# Patient Record
Sex: Female | Born: 1980
Health system: Southern US, Community
[De-identification: ages and names within clinical notes are randomized; demographics above are authoritative.]

## PROBLEM LIST (undated history)

## (undated) DIAGNOSIS — Z789 Other specified health status: Secondary | ICD-10-CM

## (undated) DIAGNOSIS — T7840XA Allergy, unspecified, initial encounter: Secondary | ICD-10-CM

## (undated) HISTORY — DX: Allergy, unspecified, initial encounter: T78.40XA

## (undated) HISTORY — DX: Other specified health status: Z78.9

## (undated) HISTORY — PX: NO PAST SURGERIES: SHX2092

---

## 2015-07-09 ENCOUNTER — Ambulatory Visit (INDEPENDENT_AMBULATORY_CARE_PROVIDER_SITE_OTHER): Payer: 59 | Admitting: Internal Medicine

## 2015-07-09 ENCOUNTER — Encounter: Payer: Self-pay | Admitting: Internal Medicine

## 2015-07-09 DIAGNOSIS — J309 Allergic rhinitis, unspecified: Secondary | ICD-10-CM | POA: Insufficient documentation

## 2015-07-09 DIAGNOSIS — J302 Other seasonal allergic rhinitis: Secondary | ICD-10-CM

## 2015-07-09 DIAGNOSIS — E669 Obesity, unspecified: Secondary | ICD-10-CM | POA: Insufficient documentation

## 2015-07-09 MED ORDER — THERA VITAL M PO TABS: 1.0000 | ORAL_TABLET | Freq: Every day | ORAL | Status: DC
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
## 2015-07-09 NOTE — Patient Instructions (Signed)
All other Health Maintenance issues reviewed.   All recommended immunizations and age-appropriate screenings are up-to-date.  No immunizations administered today.   Medications reviewed and updated.  No changes recommended at this time.    Health Maintenance, Female Adopting a healthy lifestyle and getting preventive care can go a long way to promote health and wellness. Talk with your health care provider about what schedule of regular examinations is right for you. This is a good chance for you to check in with your provider about disease prevention and staying healthy. In between checkups, there are plenty of things you can do on your own. Experts have done a lot of research about which lifestyle changes and preventive measures are most likely to keep you healthy. Ask your health care provider for more information. WEIGHT AND DIET  Eat a healthy diet  Be sure to include plenty of vegetables, fruits, low-fat dairy products, and lean protein.  Do not eat a lot of foods high in solid fats, added sugars, or salt.  Get regular exercise. This is one of the most important things you can do for your health.  Most adults should exercise for at least 150 minutes each week. The exercise should increase your heart rate and make you sweat (moderate-intensity exercise).  Most adults should also do strengthening exercises at least twice a week. This is in addition to the moderate-intensity exercise.  Maintain a healthy weight  Body mass index (BMI) is a measurement that can be used to identify possible weight problems. It estimates body fat based on height and weight. Your health care provider can help determine your BMI and help you achieve or maintain a healthy weight.  For females 61 years of age and older:   A BMI below 18.5 is considered underweight.  A BMI of 18.5 to 24.9 is normal.  A BMI of 25 to 29.9 is considered overweight.  A BMI of 30 and above is considered obese.  Watch  levels of cholesterol and blood lipids  You should start having your blood tested for lipids and cholesterol at 34 years of age, then have this test every 5 years.  You may need to have your cholesterol levels checked more often if:  Your lipid or cholesterol levels are high.  You are older than 34 years of age.  You are at high risk for heart disease.  CANCER SCREENING   Lung Cancer  Lung cancer screening is recommended for adults 72-50 years old who are at high risk for lung cancer because of a history of smoking.  A yearly low-dose CT scan of the lungs is recommended for people who:  Currently smoke.  Have quit within the past 15 years.  Have at least a 30-pack-year history of smoking. A pack year is smoking an average of one pack of cigarettes a day for 1 year.  Yearly screening should continue until it has been 15 years since you quit.  Yearly screening should stop if you develop a health problem that would prevent you from having lung cancer treatment.  Breast Cancer  Practice breast self-awareness. This means understanding how your breasts normally appear and feel.  It also means doing regular breast self-exams. Let your health care provider know about any changes, no matter how small.  If you are in your 20s or 30s, you should have a clinical breast exam (CBE) by a health care provider every 1-3 years as part of a regular health exam.  If you are 40 or  older, have a CBE every year. Also consider having a breast X-ray (mammogram) every year.  If you have a family history of breast cancer, talk to your health care provider about genetic screening.  If you are at high risk for breast cancer, talk to your health care provider about having an MRI and a mammogram every year.  Breast cancer gene (BRCA) assessment is recommended for women who have family members with BRCA-related cancers. BRCA-related cancers include:  Breast.  Ovarian.  Tubal.  Peritoneal  cancers.  Results of the assessment will determine the need for genetic counseling and BRCA1 and BRCA2 testing. Cervical Cancer Your health care provider may recommend that you be screened regularly for cancer of the pelvic organs (ovaries, uterus, and vagina). This screening involves a pelvic examination, including checking for microscopic changes to the surface of your cervix (Pap test). You may be encouraged to have this screening done every 3 years, beginning at age 86.  For women ages 61-65, health care providers may recommend pelvic exams and Pap testing every 3 years, or they may recommend the Pap and pelvic exam, combined with testing for human papilloma virus (HPV), every 5 years. Some types of HPV increase your risk of cervical cancer. Testing for HPV may also be done on women of any age with unclear Pap test results.  Other health care providers may not recommend any screening for nonpregnant women who are considered low risk for pelvic cancer and who do not have symptoms. Ask your health care provider if a screening pelvic exam is right for you.  If you have had past treatment for cervical cancer or a condition that could lead to cancer, you need Pap tests and screening for cancer for at least 20 years after your treatment. If Pap tests have been discontinued, your risk factors (such as having a new sexual partner) need to be reassessed to determine if screening should resume. Some women have medical problems that increase the chance of getting cervical cancer. In these cases, your health care provider may recommend more frequent screening and Pap tests. Colorectal Cancer  This type of cancer can be detected and often prevented.  Routine colorectal cancer screening usually begins at 34 years of age and continues through 34 years of age.  Your health care provider may recommend screening at an earlier age if you have risk factors for colon cancer.  Your health care provider may also  recommend using home test kits to check for hidden blood in the stool.  A small camera at the end of a tube can be used to examine your colon directly (sigmoidoscopy or colonoscopy). This is done to check for the earliest forms of colorectal cancer.  Routine screening usually begins at age 22.  Direct examination of the colon should be repeated every 5-10 years through 34 years of age. However, you may need to be screened more often if early forms of precancerous polyps or small growths are found. Skin Cancer  Check your skin from head to toe regularly.  Tell your health care provider about any new moles or changes in moles, especially if there is a change in a mole's shape or color.  Also tell your health care provider if you have a mole that is larger than the size of a pencil eraser.  Always use sunscreen. Apply sunscreen liberally and repeatedly throughout the day.  Protect yourself by wearing long sleeves, pants, a wide-brimmed hat, and sunglasses whenever you are outside. HEART DISEASE, DIABETES,  AND HIGH BLOOD PRESSURE   High blood pressure causes heart disease and increases the risk of stroke. High blood pressure is more likely to develop in:  People who have blood pressure in the high end of the normal range (130-139/85-89 mm Hg).  People who are overweight or obese.  People who are African American.  If you are 70-80 years of age, have your blood pressure checked every 3-5 years. If you are 7 years of age or older, have your blood pressure checked every year. You should have your blood pressure measured twice--once when you are at a hospital or clinic, and once when you are not at a hospital or clinic. Record the average of the two measurements. To check your blood pressure when you are not at a hospital or clinic, you can use:  An automated blood pressure machine at a pharmacy.  A home blood pressure monitor.  If you are between 56 years and 59 years old, ask your health  care provider if you should take aspirin to prevent strokes.  Have regular diabetes screenings. This involves taking a blood sample to check your fasting blood sugar level.  If you are at a normal weight and have a low risk for diabetes, have this test once every three years after 34 years of age.  If you are overweight and have a high risk for diabetes, consider being tested at a younger age or more often. PREVENTING INFECTION  Hepatitis B  If you have a higher risk for hepatitis B, you should be screened for this virus. You are considered at high risk for hepatitis B if:  You were born in a country where hepatitis B is common. Ask your health care provider which countries are considered high risk.  Your parents were born in a high-risk country, and you have not been immunized against hepatitis B (hepatitis B vaccine).  You have HIV or AIDS.  You use needles to inject street drugs.  You live with someone who has hepatitis B.  You have had sex with someone who has hepatitis B.  You get hemodialysis treatment.  You take certain medicines for conditions, including cancer, organ transplantation, and autoimmune conditions. Hepatitis C  Blood testing is recommended for:  Everyone born from [birth year] through 1964.  Anyone with known risk factors for hepatitis C. Sexually transmitted infections (STIs)  You should be screened for sexually transmitted infections (STIs) including gonorrhea and chlamydia if:  You are sexually active and are younger than 34 years of age.  You are older than 34 years of age and your health care provider tells you that you are at risk for this type of infection.  Your sexual activity has changed since you were last screened and you are at an increased risk for chlamydia or gonorrhea. Ask your health care provider if you are at risk.  If you do not have HIV, but are at risk, it may be recommended that you take a prescription medicine daily to prevent HIV  infection. This is called pre-exposure prophylaxis (PrEP). You are considered at risk if:  You are sexually active and do not regularly use condoms or know the HIV status of your partner(s).  You take drugs by injection.  You are sexually active with a partner who has HIV. Talk with your health care provider about whether you are at high risk of being infected with HIV. If you choose to begin PrEP, you should first be tested for HIV. You should then be  tested every 3 months for as long as you are taking PrEP.  PREGNANCY   If you are premenopausal and you may become pregnant, ask your health care provider about preconception counseling.  If you may become pregnant, take 400 to 800 micrograms (mcg) of folic acid every day.  If you want to prevent pregnancy, talk to your health care provider about birth control (contraception). OSTEOPOROSIS AND MENOPAUSE   Osteoporosis is a disease in which the bones lose minerals and strength with aging. This can result in serious bone fractures. Your risk for osteoporosis can be identified using a bone density scan.  If you are 6 years of age or older, or if you are at risk for osteoporosis and fractures, ask your health care provider if you should be screened.  Ask your health care provider whether you should take a calcium or vitamin D supplement to lower your risk for osteoporosis.  Menopause may have certain physical symptoms and risks.  Hormone replacement therapy may reduce some of these symptoms and risks. Talk to your health care provider about whether hormone replacement therapy is right for you.  HOME CARE INSTRUCTIONS   Schedule regular health, dental, and eye exams.  Stay current with your immunizations.   Do not use any tobacco products including cigarettes, chewing tobacco, or electronic cigarettes.  If you are pregnant, do not drink alcohol.  If you are breastfeeding, limit how much and how often you drink alcohol.  Limit  alcohol intake to no more than 1 drink per day for nonpregnant women. One drink equals 12 ounces of beer, 5 ounces of wine, or 1 ounces of hard liquor.  Do not use street drugs.  Do not share needles.  Ask your health care provider for help if you need support or information about quitting drugs.  Tell your health care provider if you often feel depressed.  Tell your health care provider if you have ever been abused or do not feel safe at home.   This information is not intended to replace advice given to you by your health care provider. Make sure you discuss any questions you have with your health care provider.   Document Released: 12/09/2010 Document Revised: 06/16/2014 Document Reviewed: 04/27/2013 Elsevier Interactive Patient Education [employer].
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
## 2015-07-09 NOTE — Assessment & Plan Note (Signed)
Discussed importance of weight loss - reviewed medical problems the extra weight increases her risk for Regular exercise encouraged Decrease portions

## 2015-07-09 NOTE — Assessment & Plan Note (Signed)
Takes otc allergy medication as needed
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
## 2018-02-05 ENCOUNTER — Ambulatory Visit: Payer: 59 | Admitting: Internal Medicine

## 2018-02-05 ENCOUNTER — Encounter: Payer: Self-pay | Admitting: Internal Medicine

## 2018-02-05 VITALS — BP 136/88 | HR 61 | Temp 98.4°F | Resp 16 | Ht 66.0 in | Wt 185.4 lb

## 2018-02-05 DIAGNOSIS — R609 Edema, unspecified: Secondary | ICD-10-CM | POA: Insufficient documentation

## 2018-02-05 MED ORDER — CEPHALEXIN 500 MG PO CAPS: 500.0000 mg | ORAL_CAPSULE | Freq: Three times a day (TID) | ORAL | 0 refills | Status: DC

## 2018-02-05 NOTE — Patient Instructions (Addendum)
Continue advil over the weekend.  Increase your water intake.    You can continue applying heat to the area.  Massage the area.    Try sucking on sour lemon candy.      Take the antibiotic if needed for increased swelling, redness, warmth and/or fevers.   Call if no improvement on Tuesday morning.

## 2018-02-05 NOTE — Assessment & Plan Note (Signed)
The area of swelling and tenderness is consistent with the parotid gland Possible parotid stone Increase water intake Advil as needed Massage and apply heat to the area Advised her to suck on sour lemon candy No obvious evidence of infection, but concerned that symptoms have gotten worse.  She is very reluctant to take an antibiotic and I did give her a prescription and advised her over the next day or 2 if her symptoms worsen or if she has any redness or fever and she needs to take it She will call early next week if her symptoms are not getting better or worse and we will do imaging or refer to ENT
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
## 2019-02-18 ENCOUNTER — Encounter: Payer: Self-pay | Admitting: Internal Medicine

## 2019-02-18 ENCOUNTER — Other Ambulatory Visit: Payer: Self-pay

## 2019-02-18 ENCOUNTER — Ambulatory Visit (INDEPENDENT_AMBULATORY_CARE_PROVIDER_SITE_OTHER): Payer: 59 | Admitting: Internal Medicine

## 2019-02-18 DIAGNOSIS — M25551 Pain in right hip: Secondary | ICD-10-CM | POA: Diagnosis not present

## 2019-02-18 NOTE — Patient Instructions (Signed)
Make an appointment with our sports medicine doctor - Dr Tamala Julian for your hip/back pain and leg numbness/tingling.

## 2019-02-18 NOTE — Assessment & Plan Note (Signed)
Right hip and lower back pain With numbness/tingling in right leg Will refer to sports medicine for further evaluation/treatment

## 2019-03-20 NOTE — Progress Notes (Signed)
[facility] [address] [place], [place] [address] Phone: [phone] Subjective:   I Pamela Aguirre am serving as a Neurosurgeon for Dr. Antoine Primas.     CC: Right leg numbness  [id number]  Pamela Aguirre is a 38 y.o. female coming in with complaint of right leg neuropathy. Dr. Lawerance Bach believes it has something to do with her back. Numbness and tingling down the right leg to the toes.   Onset- Chronic  Location - Right leg  Duration-intermittent Character- constant numbness  Aggravating factors- laying, standing  Reliving factors-patient sensitive sometimes increasing activity or decreasing activity seems to make it better. Therapies tried-nothing specific Severity-5 out of 10 but seems to be increasing in frequency     Past Medical History:  Diagnosis Date  . Allergy    Past Surgical History:  Procedure Laterality Date  . NO PAST SURGERIES     Social History   Socioeconomic History  . Marital status: Single    Spouse name: Not on file  . Number of children: Not on file  . Years of education: Not on file  . Highest education level: Not on file  Occupational History  . Not on file  Social Needs  . Financial resource strain: Not on file  . Food insecurity    Worry: Not on file    Inability: Not on file  . Transportation needs    Medical: Not on file    Non-medical: Not on file  Tobacco Use  . Smoking status: Former Games developer  . Smokeless tobacco: Never Used  Substance and Sexual Activity  . Alcohol use: Yes    Comment: social  . Drug use: No  . Sexual activity: Not on file  Lifestyle  . Physical activity    Days per week: Not on file    Minutes per session: Not on file  . Stress: Not on file  Relationships  . Social Musician on phone: Not on file    Gets together: Not on file    Attends religious service: Not on file    Active member of club or organization: Not on file    Attends meetings of clubs or  organizations: Not on file    Relationship status: Not on file  Other Topics Concern  . Not on file  Social History Narrative   Job: Oceanographer for production      Exercise: no    No Known Allergies Family History  Problem Relation Age of Onset  . Hypertension Mother   . Diabetes Maternal Aunt   . Hypertension Maternal Grandmother   . Heart disease Maternal Grandmother          Current Outpatient Medications (Other):  .  gabapentin (NEURONTIN) 100 MG capsule, Take 2 capsules (200 mg total) by mouth at bedtime.    Past medical history, social, surgical and family history all reviewed in electronic medical record.  No pertanent information unless stated regarding to the chief complaint.   Review of Systems:  No headache, visual changes, nausea, vomiting, diarrhea, constipation, dizziness, abdominal pain, skin rash, fevers, chills, night sweats, weight loss, swollen lymph nodes, body aches, joint swelling, , chest pain, shortness of breath, mood changes.  Positive muscle aches  Objective  Blood pressure 120/90, pulse 96, height 5\' 6"  (1.676 m), weight 180 lb (81.6 kg), last menstrual period 03/13/2019, SpO2 98 %.    General: No apparent distress alert and oriented x3 mood and affect normal,  dressed appropriately.  HEENT: Pupils equal, extraocular movements intact  Respiratory: Patient's speak in full sentences and does not appear short of breath  Cardiovascular: No lower extremity edema, non tender, no erythema  Skin: Warm dry intact with no signs of infection or rash on extremities or on axial skeleton.  Abdomen: Soft nontender  Neuro: Cranial nerves II through XII are intact, neurovascularly intact in all extremities with 2+ DTRs and 2+ pulses.  Lymph: No lymphadenopathy of posterior or anterior cervical chain or axillae bilaterally.  Gait normal with good balance and coordination.  MSK:  Non tender with full range of motion and good stability and symmetric strength  and tone of shoulders, elbows, wrist,  knee and ankles bilaterally.  Hip: Right ROM Internal and external range of motion noted. Strength IR: 5/5, ER: 5/5, Flexion: 5/5, Extension: 5/5, Abduction: 5/5, Adduction: 5/5 Pelvic alignment unremarkable to inspection and palpation. Standing hip rotation and gait without trendelenburg sign / unsteadiness. Greater trochanter without tenderness to palpation. No tenderness over piriformis and greater trochanter. No pain with FABER or FADIR. No SI joint tenderness and normal minimal SI movement. Contralateral hip unremarkable  Back Exam:  Inspection: Loss of lordosis Motion: Flexion 45 deg, Extension 25 deg, Side Bending to 35 deg bilaterally,  Rotation to 35 deg bilaterally  SLR laying: Negative  XSLR laying: Negative  Palpable tenderness: Tender to palpation laterally in the paraspinal musculature of the lumbar spine. FABER: negative. Sensory change: Gross sensation intact to all lumbar and sacral dermatomes.  Reflexes: 2+ at both patellar tendons, 2+ at achilles tendons, Babinski's downgoing.  Strength at foot  Plantar-flexion: 5/5 Dorsi-flexion: 5/5 Eversion: 5/5 Inversion: 5/5  Leg strength  Quad: 5/5 Hamstring: 5/5 Hip flexor: 5/5 Hip abductors: 5/5  Gait unremarkable.  Right foot exam shows the patient does have some longitudinal arch breakdown with overpronation.  Mild breakdown the transverse arch.  No splaying between the toes.  No true neuropathy noted today.    Impression and Recommendations:     This case required medical decision making of moderate complexity. The above documentation has been reviewed and is accurate and complete Lyndal Pulley, DO       Note: This dictation was prepared with Dragon dictation along with smaller phrase technology. Any transcriptional errors that result from this process are unintentional.

## 2019-03-22 ENCOUNTER — Ambulatory Visit: Payer: 59 | Admitting: Family Medicine

## 2019-03-22 ENCOUNTER — Ambulatory Visit (INDEPENDENT_AMBULATORY_CARE_PROVIDER_SITE_OTHER)
Admission: RE | Admit: 2019-03-22 | Discharge: 2019-03-22 | Disposition: A | Discharge status: Home / Self Care | Payer: 59 | Source: Ambulatory Visit | Attending: Family Medicine | Admitting: Family Medicine

## 2019-03-22 ENCOUNTER — Encounter: Payer: Self-pay | Admitting: Family Medicine

## 2019-03-22 ENCOUNTER — Other Ambulatory Visit: Payer: Self-pay

## 2019-03-22 VITALS — BP 120/90 | HR 96 | Ht 66.0 in | Wt 180.0 lb

## 2019-03-22 DIAGNOSIS — M545 Low back pain, unspecified: Secondary | ICD-10-CM

## 2019-03-22 DIAGNOSIS — R200 Anesthesia of skin: Secondary | ICD-10-CM | POA: Insufficient documentation

## 2019-03-22 DIAGNOSIS — M79671 Pain in right foot: Secondary | ICD-10-CM | POA: Diagnosis not present

## 2019-03-22 DIAGNOSIS — M216X9 Other acquired deformities of unspecified foot: Secondary | ICD-10-CM | POA: Insufficient documentation

## 2019-03-22 DIAGNOSIS — M216X1 Other acquired deformities of right foot: Secondary | ICD-10-CM | POA: Diagnosis not present

## 2019-03-22 MED ORDER — GABAPENTIN 100 MG PO CAPS: 200.0000 mg | ORAL_CAPSULE | Freq: Every day | ORAL | 3 refills | Status: DC

## 2019-03-22 NOTE — Assessment & Plan Note (Signed)
Pronation deformity of the right ankle.

## 2019-03-22 NOTE — Assessment & Plan Note (Addendum)
Patient is having minimal pain seems to be more activity.  To be very intermittent.  Discussed with patient about icing regimen home exercise, concerned that this could be secondary to more of a lumbar radiculopathy and given mild gabapentin.  Nerve conduction study may be necessary for continued to have difficulty.  X-rays pending at the moment.  Follow-up again in 4 to 8 weeks.

## 2019-03-22 NOTE — Patient Instructions (Signed)
Good to see you Back and foot xray Spenco orthotics "total support" Vitamin d 2,000 IU and B Complex  Gabapentin 200 mg at night See me again in 5-6 weeks

## 2019-04-25 NOTE — Progress Notes (Signed)
[facility] [address] [place] [place], [place] [address] Phone: [phone] Subjective:   I Pamela Aguirre am serving as a Neurosurgeon for Dr. Antoine Primas.   CC: Foot pain and hip pain  [id number]  Pamela Aguirre is a 38 y.o. female coming in with complaint of back pain. Patient states that she is getting better. Still some pain but overall doing well.  Patient states that she is feeling approximately 80% better overall.  Patient seen some mild discomfort from time to time.  Patient is very Regulatory affairs officer on doing the exercises.  Patient unable to tolerate the gabapentin.      Past Medical History:  Diagnosis Date  . Allergy    Past Surgical History:  Procedure Laterality Date  . NO PAST SURGERIES     Social History   Socioeconomic History  . Marital status: Single    Spouse name: Not on file  . Number of children: Not on file  . Years of education: Not on file  . Highest education level: Not on file  Occupational History  . Not on file  Social Needs  . Financial resource strain: Not on file  . Food insecurity    Worry: Not on file    Inability: Not on file  . Transportation needs    Medical: Not on file    Non-medical: Not on file  Tobacco Use  . Smoking status: Former Games developer  . Smokeless tobacco: Never Used  Substance and Sexual Activity  . Alcohol use: Yes    Comment: social  . Drug use: No  . Sexual activity: Not on file  Lifestyle  . Physical activity    Days per week: Not on file    Minutes per session: Not on file  . Stress: Not on file  Relationships  . Social Musician on phone: Not on file    Gets together: Not on file    Attends religious service: Not on file    Active member of club or organization: Not on file    Attends meetings of clubs or organizations: Not on file    Relationship status: Not on file  Other Topics Concern  . Not on file  Social History Narrative   Job: Oceanographer for production    Exercise: no    No Known Allergies Family History  Problem Relation Age of Onset  . Hypertension Mother   . Diabetes Maternal Aunt   . Hypertension Maternal Grandmother   . Heart disease Maternal Grandmother    No current outpatient medications on file.    Past medical history, social, surgical and family history all reviewed in electronic medical record.  No pertanent information unless stated regarding to the chief complaint.   Review of Systems:  No headache, visual changes, nausea, vomiting, diarrhea, constipation, dizziness, abdominal pain, skin rash, fevers, chills, night sweats, weight loss, swollen lymph nodes, body aches, joint swelling, muscle aches, chest pain, shortness of breath, mood changes.   Objective  Blood pressure 120/90, pulse 66, height 5\' 6"  (1.676 m), weight 184 lb (83.5 kg), SpO2 98 %.    General: No apparent distress alert and oriented x3 mood and affect normal, dressed appropriately.  HEENT: Pupils equal, extraocular movements intact  Respiratory: Patient's speak in full sentences and does not appear short of breath  Cardiovascular: No lower extremity edema, non tender, no erythema  Skin: Warm dry intact with no signs of infection or rash on extremities or on  axial skeleton.  Abdomen: Soft nontender  Neuro: Cranial nerves II through XII are intact, neurovascularly intact in all extremities with 2+ DTRs and 2+ pulses.  Lymph: No lymphadenopathy of posterior or anterior cervical chain or axillae bilaterally.  Gait normal with good balance and coordination.  MSK:  Non tender with full range of motion and good stability and symmetric strength and tone of shoulders, elbows, wrist,  knee and ankles bilaterally.   Low back exam shows the patient does have some minimal tenderness to palpation more over this L5-S1 bilaterally.  Patient does have near full range of motion of the back though noted which is an improvement from previous exam.  Patient does have some  mild tightness with straight leg test but no radicular symptoms.  Foot exam still shows the patient has overpronation of the feet bilaterally but nontender on exam   Impression and Recommendations:     This case required medical decision making of moderate complexity. The above documentation has been reviewed and is accurate and complete Pamela Pulley, DO       Note: This dictation was prepared with Dragon dictation along with smaller phrase technology. Any transcriptional errors that result from this process are unintentional.

## 2019-04-26 ENCOUNTER — Ambulatory Visit (INDEPENDENT_AMBULATORY_CARE_PROVIDER_SITE_OTHER): Payer: 59 | Admitting: Family Medicine

## 2019-04-26 ENCOUNTER — Encounter: Payer: Self-pay | Admitting: Family Medicine

## 2019-04-26 ENCOUNTER — Other Ambulatory Visit: Payer: Self-pay

## 2019-04-26 DIAGNOSIS — M25551 Pain in right hip: Secondary | ICD-10-CM | POA: Diagnosis not present

## 2019-04-26 NOTE — Patient Instructions (Signed)
Good to see you Doing well Try the exercises twice a week at least Continue vitamins See me again in 2 months

## 2019-04-26 NOTE — Assessment & Plan Note (Signed)
Patient has made significant improvement.  No significant neck pain at the moment.  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  Will monitor.  Patient unable to tolerate the gabapentin.  Follow-up again in 4 weeks

## 2019-06-28 ENCOUNTER — Ambulatory Visit: Payer: 59 | Admitting: Family Medicine

## 2019-07-21 ENCOUNTER — Ambulatory Visit: Payer: 59 | Admitting: Podiatry

## 2019-07-21 ENCOUNTER — Other Ambulatory Visit: Payer: Self-pay

## 2019-07-21 ENCOUNTER — Encounter: Payer: Self-pay | Admitting: Podiatry

## 2019-07-21 VITALS — BP 173/84 | HR 83 | Resp 16

## 2019-07-21 DIAGNOSIS — L603 Nail dystrophy: Secondary | ICD-10-CM

## 2019-07-21 NOTE — Progress Notes (Signed)
  Subjective:  Patient ID: Pamela Aguirre, female    DOB: 11/18/1980,  MRN: [id number] HPI Chief Complaint  Patient presents with  . Nail Problem    Hallux nail right - loosened toenail-only attached at the medial side, no injury, been using antifungal solution prior to the nail detaching  . Toe Pain    5th toe right - lateral border, tender  . New Patient (Initial Visit)    39 y.o. female presents with the above complaint.   ROS: Denies fever chills nausea vomiting muscle aches pains calf pain back pain chest pain shortness of breath.  Past Medical History:  Diagnosis Date  . Allergy    Past Surgical History:  Procedure Laterality Date  . NO PAST SURGERIES     No current outpatient medications on file.  No Known Allergies Review of Systems Objective:   Vitals:   07/21/19 1514  BP: (!) 173/84  Pulse: 83  Resp: 16    General: Well developed, nourished, in no acute distress, alert and oriented x3   Dermatological: Skin is warm, dry and supple bilateral. Nails x 10 are well maintained; remaining integument appears unremarkable at this time. There are no open sores, no preulcerative lesions, no rash or signs of infection present.  A nail that is in the process of the Foley aiding there is no soft tissue involvement.  No purulence no malodor no signs of infection at all.  Vascular: Dorsalis Pedis artery and Posterior Tibial artery pedal pulses are 2/4 bilateral with immedate capillary fill time. Pedal hair growth present. No varicosities and no lower extremity edema present bilateral.   Neruologic: Grossly intact via light touch bilateral. Vibratory intact via tuning fork bilateral. Protective threshold with Semmes Wienstein monofilament intact to all pedal sites bilateral. Patellar and Achilles deep tendon reflexes 2+ bilateral. No Babinski or clonus noted bilateral.   Musculoskeletal: No gross boney pedal deformities bilateral. No pain, crepitus, or limitation noted with foot  and ankle range of motion bilateral. Muscular strength 5/5 in all groups tested bilateral.  Gait: Unassisted, Nonantalgic.    Radiographs:  None taken  Assessment & Plan:   Assessment: Nail dystrophy loose nail hallux right.  Adductovarus rotated hammertoe deformity fifth right  Plan: Discussed appropriate shoe gear stretching exercise ice therapy shoe gear modifications.  Discussed surgical intervention regarding derotational arthroplasty fifth digit right foot and I helped her trim the toenail off today.  Removed it in total without complications and without anesthesia.     Max T. [place], [place]

## 2019-08-30 ENCOUNTER — Ambulatory Visit (INDEPENDENT_AMBULATORY_CARE_PROVIDER_SITE_OTHER): Payer: 59 | Admitting: Family Medicine

## 2019-08-30 ENCOUNTER — Encounter: Payer: Self-pay | Admitting: Family Medicine

## 2019-08-30 ENCOUNTER — Other Ambulatory Visit: Payer: Self-pay

## 2019-08-30 VITALS — BP 140/96 | HR 81 | Ht 66.0 in | Wt 185.4 lb

## 2019-08-30 DIAGNOSIS — S39012A Strain of muscle, fascia and tendon of lower back, initial encounter: Secondary | ICD-10-CM | POA: Diagnosis not present

## 2019-08-30 DIAGNOSIS — S29012A Strain of muscle and tendon of back wall of thorax, initial encounter: Secondary | ICD-10-CM

## 2019-08-30 DIAGNOSIS — Q7649 Other congenital malformations of spine, not associated with scoliosis: Secondary | ICD-10-CM | POA: Insufficient documentation

## 2019-08-30 NOTE — Patient Instructions (Addendum)
Thank you for coming in today. Attend PT.  Recheck with Dr Katrinka Blazing or myself in 6 weeks unless better.  Return or contact me if not doing well.  Come back or go to the emergency room if you notice new weakness new numbness problems walking or bowel or bladder problems.  Use a heating pad and TENS unit.   TENS UNIT: This is helpful for muscle pain and spasm.   Search and Purchase a TENS 7000 2nd edition at  www.tenspros.com or www.Amazon.com It should be less than $30.     TENS unit instructions: Do not shower or bathe with the unit on Turn the unit off before removing electrodes or batteries If the electrodes lose stickiness add a drop of water to the electrodes after they are disconnected from the unit and place on plastic sheet. If you continued to have difficulty, call the TENS unit company to purchase more electrodes. Do not apply lotion on the skin area prior to use. Make sure the skin is clean and dry as this will help prolong the life of the electrodes. After use, always check skin for unusual red areas, rash or other skin difficulties. If there are any skin problems, does not apply electrodes to the same area. Never remove the electrodes from the unit by pulling the wires. Do not use the TENS unit or electrodes other than as directed. Do not change electrode placement without consultating your therapist or physician. Keep 2 fingers with between each electrode. Wear time ratio is 2:1, on to off times.    For example on for 30 minutes off for 15 minutes and then on for 30 minutes off for 15 minutes      Lumbosacral Strain Lumbosacral strain is an injury that causes pain in the lower back (lumbosacral spine). This injury usually happens from overstretching the muscles or ligaments along your spine. Ligaments are cord-like tissues that connect bones to other bones. A strain can affect one or more muscles or ligaments. What are the causes? This condition may be caused by:  A  hard, direct hit to the back.  Overstretching the lower back muscles. This may result from: ? A fall. ? Lifting something heavy. ? Repetitive movements such as bending or crouching. What increases the risk? The following factors may make you more likely to develop this condition:  Participating in sports or activities that involve: ? A sudden twist of the back. ? Pushing or pulling motions.  Being overweight or obese.  Having poor strength and flexibility, especially tight hamstrings or weak muscles in the back or abdomen.  Having too much of a curve in the lower back.  Having a pelvis that is tilted forward. What are the signs or symptoms? The main symptom of this condition is pain in the lower back, at the site of the strain. Pain may also be felt down one or both legs. How is this diagnosed? This condition is diagnosed based on your symptoms, your medical history, and a physical exam. During the physical exam, your health care provider may push on certain areas of your back to find the source of your pain. You may be asked to bend forward, backward, and side to side to check your pain and range of motion. You may also have imaging tests, such as X-rays and an MRI. How is this treated? This condition may be treated by:  Applying heat and cold on the affected area.  Taking medicines to help relieve pain and relax your  muscles.  Taking NSAIDs, such as ibuprofen, to help reduce swelling and discomfort.  Doing stretching and strengthening exercises for your lower back. Symptoms usually improve within several weeks of treatment. However, recovery time varies. When your symptoms improve, gradually return to your normal routine as soon as possible to reduce pain, avoid stiffness, and keep muscle strength. Follow these instructions at home: Medicines  Take over-the-counter and prescription medicines only as told by your health care provider.  Ask your health care provider if the  medicine prescribed to you: ? Requires you to avoid driving or using heavy machinery. ? Can cause constipation. You may need to take these actions to prevent or treat constipation:  Drink enough fluid to keep your urine pale yellow.  Take over-the-counter or prescription medicines.  Eat foods that are high in fiber, such as beans, whole grains, and fresh fruits and vegetables.  Limit foods that are high in fat and processed sugars, such as fried or sweet foods. Managing pain, stiffness, and swelling      If directed, put ice on the injured area. To do this: ? Put ice in a plastic bag. ? Place a towel between your skin and the bag. ? Leave the ice on for 20 minutes, 2-3 times a day.  If directed, apply heat on the affected area as often as told by your health care provider. Use the heat source that your health care provider recommends, such as a moist heat pack or a heating pad. ? Place a towel between your skin and the heat source. ? Leave the heat on for 20-30 minutes. ? Remove the heat if your skin turns bright red. This is especially important if you are unable to feel pain, heat, or cold. You may have a greater risk of getting burned. Activity  Rest as told by your health care provider.  Do not stay in bed. Staying in bed for more than 1-2 days can delay your recovery.  Return to your normal activities as told by your health care provider. Ask your health care provider what activities are safe for you.  Avoid activities that take a lot of energy for as long as told by your health care provider.  Do exercises as told by your health care provider. This includes stretching and strengthening exercises. General instructions  Sit up and stand up straight. Avoid leaning forward when you sit, or hunching over when you stand.  Do not use any products that contain nicotine or tobacco, such as cigarettes, e-cigarettes, and chewing tobacco. If you need help quitting, ask your health  care provider.  Keep all follow-up visits as told by your health care provider. This is important. How is this prevented?   Use correct form when playing sports and lifting heavy objects.  Use good posture when sitting and standing.  Maintain a healthy weight.  Sleep on a mattress with medium firmness to support your back.  Do at least 150 minutes of moderate-intensity exercise each week, such as brisk walking or water aerobics. Try a form of exercise that takes stress off your back, such as swimming or stationary cycling.  Maintain physical fitness, including: ? Strength. ? Flexibility. Contact a health care provider if:  Your back pain does not improve after several weeks of treatment.  Your symptoms get worse. Get help right away if:  Your back pain is severe.  You cannot stand or walk.  You have difficulty controlling when you urinate or when you have a bowel movement.  You feel nauseous or you vomit.  Your feet or legs get very cold, turn pale, or look blue.  You have numbness, tingling, weakness, or problems using your arms or legs.  You develop any of the following: ? Shortness of breath. ? Dizziness. ? Pain in your legs. ? Weakness in your buttocks or legs. Summary  Lumbosacral strain is an injury that causes pain in the lower back (lumbosacral spine).  This injury usually happens from overstretching the muscles or ligaments along your spine.  This condition may be caused by a direct hit to the lower back or by overstretching the lower back muscles.  Symptoms usually improve within several weeks of treatment. This information is not intended to replace advice given to you by your health care provider. Make sure you discuss any questions you have with your health care provider. Document Revised: 10/19/2018 Document Reviewed: 10/19/2018 Elsevier Patient Education  2020 [employer].

## 2019-08-30 NOTE — Progress Notes (Signed)
I, Christoper Fabian, LAT, ATC, am serving as scribe for Dr. Clementeen Graham.  Pamela Aguirre is a 39 y.o. female who presents to [facility] today for R-sided low back pain.  She was last seen by Dr. Silvio Clayman on 04/26/19 for her R hip and was provided a HEP and advised on some OTC supplements/vitamins.  She had previously tried Gabapentin but could not tolerate this medication.  Since her last visit w/ Dr. Katrinka Blazing, pt reports midline low back pain and R-sided mid-Tspine pain x approximately 2 weeks w/ no known MOI.  Aggravating factors include sitting, prolonged standing, laying supine, lumbar flexion.  She reports some radiating pain into her B LEs.  She denies any numbness/tingling or weakness in her B LEs.  She is taking IBU.  Diagnostic imaging: L-spine XR- 03/22/19   Pertinent review of systems: No fevers or chills  Relevant historical information: Hemivertebra L4 dextroconvex with lateral listhesis on x-ray October 2020   Exam:  BP (!) 140/96 (BP Location: Left Arm, Patient Position: Sitting, Cuff Size: Normal)   Pulse 81   Ht 5\' 6"  (1.676 m)   Wt 185 lb 6.4 oz (84.1 kg)   SpO2 98%   BMI 29.92 kg/m  General: Well Developed, well nourished, and in no acute distress.   MSK:  C-spine normal.  Nontender normal motion. T-spine normal-appearing nontender midline.  Tender palpation right rhomboid and thoracic paraspinal area. Normal thoracic motion. Normal upper extremity scapular motion. L-spine: Nontender midline.  Tender palpation and rigid paraspinal musculature right paraspinal muscles. Decreased lumbar motion. Lower extremity strength is intact throughout.  Reflexes and sensation are intact throughout. Mild antalgic gait.    Lab and Radiology Results  EXAM: LUMBAR SPINE - COMPLETE 4+ VIEW  COMPARISON:  None.  FINDINGS: Hemivertebra at the L4 level with dextroconvexity away from the hypoplasticw left side of the vertebral body. Disc space between  L 3-4 and L4-5. Degenerative changes in the posterior elements. Approximately 15-16 degrees of curvature related to this congenital vertebral abnormality. Right lateral listhesis of L4 on L5 approximately 8 mm  IMPRESSION: Congenital vertebral abnormality with spinal curvature and some right listhesis of L4 on L5 approximately 8 mm. This may explain patient's symptoms and would be better evaluated utilizing MRI.   Electronically Signed   By: M.D.   On: 03/23/2019 11:26 I, 03/25/2019, personally (independently) visualized and performed the interpretation of the images attached in this note.     Assessment and Plan: 39 y.o. female with low back pain and right rhomboid/thoracic paraspinal pain.  Symptoms ongoing for about 2 weeks.  Symptoms consistent with strain and spasm.  She does have congenital abnormality in L-spine that probably is contributing to this as well.  Fortunately she does not have radicular pain in her signs or symptoms suggesting neurologic problem.  Plan for heating pad TENS unit physical therapy referral.  Discussed muscle relaxers patient declined which is reasonable.  Continue prescription strength Tylenol and NSAIDs.  Caution against high-dose frequent use of NSAIDs due to elevated blood pressure. Recheck with myself or Dr. 20 in about 6 weeks especially if not better. Stressed the importance of ongoing core stabilizing exercises to prevent further back pain episodes with congenital spinal abnormality    Orders Placed This Encounter  Procedures  . Ambulatory referral to Physical Therapy    Referral Priority:   Routine    Referral Type:   Physical Medicine    Referral Reason:  Specialty Services Required    Requested Specialty:   Physical Therapy   No orders of the defined types were placed in this encounter.    Discussed warning signs or symptoms. Please see discharge instructions. Patient expresses understanding.   The above  documentation has been reviewed and is accurate and complete Lynne Leader

## 2019-09-02 ENCOUNTER — Ambulatory Visit (INDEPENDENT_AMBULATORY_CARE_PROVIDER_SITE_OTHER): Payer: 59 | Admitting: Rehabilitative and Restorative Service Providers"

## 2019-09-02 ENCOUNTER — Other Ambulatory Visit: Payer: Self-pay

## 2019-09-02 ENCOUNTER — Encounter: Payer: Self-pay | Admitting: Rehabilitative and Restorative Service Providers"

## 2019-09-02 DIAGNOSIS — R29898 Other symptoms and signs involving the musculoskeletal system: Secondary | ICD-10-CM

## 2019-09-02 DIAGNOSIS — M545 Low back pain, unspecified: Secondary | ICD-10-CM
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
## 2019-09-02 NOTE — Patient Instructions (Signed)
Access Code: [email or web address]: [email or web address]: 03/26/2021Prepared by: Chiquetta Langner HoltExercises  Cat-Camel - 2 x daily - 7 x weekly - 1 sets - 5 reps - 5 sec hold  Cat-Camel to Child's Pose - 2 x daily - 7 x weekly - 1 sets - 3 reps - 20sec hold  Supine Double Knee to Chest - 2 x daily - 7 x weekly - 1 sets - 3 reps - 20 sec hold  Supine Transversus Abdominis Bracing - Hands on Ground - 5 x daily - 7 x weekly - 1 sets - 10 reps - 10 sec hold Patient Education  TENS Unit  Posture and Body Mechanics  Trigger Point Dry Needling

## 2019-09-12 ENCOUNTER — Ambulatory Visit: Payer: 59 | Admitting: Rehabilitative and Restorative Service Providers"

## 2019-09-12 ENCOUNTER — Other Ambulatory Visit: Payer: Self-pay

## 2019-09-12 ENCOUNTER — Encounter: Payer: Self-pay | Admitting: Rehabilitative and Restorative Service Providers"

## 2019-09-12 ENCOUNTER — Ambulatory Visit (INDEPENDENT_AMBULATORY_CARE_PROVIDER_SITE_OTHER): Payer: 59 | Admitting: Rehabilitative and Restorative Service Providers"

## 2019-09-12 DIAGNOSIS — M545 Low back pain, unspecified: Secondary | ICD-10-CM

## 2019-09-12 DIAGNOSIS — R29898 Other symptoms and signs involving the musculoskeletal system: Secondary | ICD-10-CM

## 2019-09-12 NOTE — Patient Instructions (Signed)
Access Code: [email or web address]: [email or web address]: 04/05/2021Prepared by: Legend Tumminello HoltExercises  Sit to Stand without Arm Support - 2 x daily - 7 x weekly - 1 sets - 10 reps  Supine Bridge - 2 x daily - 7 x weekly - 3 reps - 1 sets - 30 sec hold  Single Leg Bridge - 2 x daily - 7 x weekly - 3 reps - 1 sets - 30 sec hold  Bridge with Arms Raised - 2 x daily - 7 x weekly - 1 sets - 10 reps - 5 sec hold  Wall Quarter Squat - 2 x daily - 7 x weekly - 1 sets - 10 reps - 10 sec hold  Standing Plank on Wall - 2 x daily - 7 x weekly - 1 sets - 10 reps - 30 sec hold  Plank on Counter - 2 x daily - 7 x weekly - 3 reps - 1 sets - 30 sec hold  Mountain Climber on Counter - 2 x daily - 7 x weekly - 3 reps - 1 sets - 30 sec hold  Plank with Hip Extension on Counter - 2 x daily - 7 x weekly - 3 reps - 1 sets - 30 sec hold  Plank with Shoulder Flexion on Counter - 2 x daily - 7 x weekly - 3 reps - 1 sets - 30 sec hold
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
## 2019-09-16 ENCOUNTER — Encounter: Payer: 59 | Admitting: Physical Therapy

## 2019-09-19 ENCOUNTER — Encounter: Payer: 59 | Admitting: Physical Therapy

## 2019-09-23 ENCOUNTER — Encounter: Payer: 59 | Admitting: Physical Therapy

## 2019-10-11 ENCOUNTER — Ambulatory Visit: Payer: 59 | Admitting: Family Medicine

## 2019-10-11 ENCOUNTER — Other Ambulatory Visit: Payer: Self-pay

## 2019-10-11 ENCOUNTER — Encounter: Payer: Self-pay | Admitting: Family Medicine

## 2019-10-11 VITALS — BP 154/90 | HR 69 | Ht 66.0 in | Wt 192.0 lb

## 2019-10-11 DIAGNOSIS — S39012D Strain of muscle, fascia and tendon of lower back, subsequent encounter: Secondary | ICD-10-CM

## 2019-10-11 DIAGNOSIS — Q7649 Other congenital malformations of spine, not associated with scoliosis: Secondary | ICD-10-CM | POA: Diagnosis not present

## 2019-10-11 NOTE — Patient Instructions (Signed)
Thank you for coming in today.  Maintain core strength.  Continue limited PT.  Could do future PT if needed.   Recheck with me as needed.

## 2019-10-11 NOTE — Progress Notes (Signed)
   I, Pamela Aguirre, LAT, ATC, am serving as scribe for Dr. Clementeen Graham.  Pamela Aguirre is a 39 y.o. female who presents to [facility] today for f/u of R-sided T-spine and low back pain.  She was last seen by Dr. Denyse Aguirre on 08/30/19 and was referred to outpatient PT of which she has completed 2 visits.  Since her last visit, pt reports her lower back is still painful with certain ADLs. She states she stands for a long period of time and feels tightness in her back.   Diagnostic imaging: L-spine XR- 03/21/20   Pertinent review of systems: No fevers or chills  Relevant historical information: Hemivertebrae L4 with dextroconvex scoliosis   Exam:  BP (!) 154/90 (BP Location: Left Arm, Patient Position: Sitting)   Pulse 69   Ht 5\' 6"  (1.676 m)   Wt 192 lb (87.1 kg)   SpO2 96%   BMI 30.99 kg/m  General: Well Developed, well nourished, and in no acute distress.   MSK: L-spine nontender. Normal lumbar motion. Leg lengths equal    Lab and Radiology Results  EXAM: LUMBAR SPINE - COMPLETE 4+ VIEW  COMPARISON:  None.  FINDINGS: Hemivertebra at the L4 level with dextroconvexity away from the hypoplasticw left side of the vertebral body. Disc space between L 3-4 and L4-5. Degenerative changes in the posterior elements. Approximately 15-16 degrees of curvature related to this congenital vertebral abnormality. Right lateral listhesis of L4 on L5 approximately 8 mm  IMPRESSION: Congenital vertebral abnormality with spinal curvature and some right listhesis of L4 on L5 approximately 8 mm. This may explain patient's symptoms and would be better evaluated utilizing MRI.   Electronically Signed   By: M.D.   On: 03/23/2019 11:26  I, 03/25/2019, personally (independently) visualized and performed the interpretation of the images attached in this note.     Assessment and Plan: 39 y.o. female with improved low back pain.  Patient  has done well with very limited initial physical therapy.  Recommend continuing physical therapy and once physical therapy completed ongoing core strengthening home exercise program.  Discussed that she does have a congenital abnormality in lumbar spine with a hemivertebrae at L4.  This is likely going to be a source of pain going forward.  Core strengthening will be very helpful to prevent or reduce episodes of further pain.  If needed in the future could proceed with further work-up including MRI and potential injection planning or even surgery.  However if she is able to get her core strong enough and feel well there is no need for further treatment otherwise.  Recheck back as needed  Discussed warning signs or symptoms. Please see discharge instructions. Patient expresses understanding.   The above documentation has been reviewed and is accurate and complete 20

## 2020-02-08 LAB — OB RESULTS CONSOLE HEPATITIS B SURFACE ANTIGEN: Hepatitis B Surface Ag: NEGATIVE

## 2020-02-08 LAB — OB RESULTS CONSOLE GC/CHLAMYDIA
Chlamydia: NEGATIVE
Gonorrhea: NEGATIVE

## 2020-02-08 LAB — OB RESULTS CONSOLE HIV ANTIBODY (ROUTINE TESTING): HIV: NONREACTIVE

## 2020-02-08 LAB — OB RESULTS CONSOLE ABO/RH: RH Type: POSITIVE

## 2020-02-08 LAB — OB RESULTS CONSOLE RPR: RPR: NONREACTIVE

## 2020-02-08 LAB — OB RESULTS CONSOLE ANTIBODY SCREEN: Antibody Screen: NEGATIVE

## 2020-02-08 LAB — OB RESULTS CONSOLE RUBELLA ANTIBODY, IGM: Rubella: IMMUNE

## 2020-06-09 NOTE — L&D Delivery Note (Signed)
Delivery Note At 5:33 PM a viable and healthy female was delivered via .  Presentation: vertex; Position: Left,, Occiput,, Anterior;   Delivery of the head: spontaneous   Second maneuver: ,  Woods screw Third maneuver: ,  McRoberts,      APGAR: 8, 9; weight pending .   Placenta status: spontaneous, intact.   Cord:  with the following complications: Gilbert x one .  Cord pH: na  Anesthesia:  epdural Episiotomy:  na Lacerations:  second Suture Repair: 2.0 vicryl rapide Est. Blood Loss (mL):  400  Mom to postpartum.  Baby to Couplet care / Skin to Skin.  Amore Grater J 09/03/2020, 5:52 PM

## 2020-07-27 LAB — OB RESULTS CONSOLE GBS: GBS: POSITIVE

## 2020-08-21 ENCOUNTER — Observation Stay (HOSPITAL_COMMUNITY): Payer: 59

## 2020-08-21 ENCOUNTER — Other Ambulatory Visit (HOSPITAL_COMMUNITY): Payer: Self-pay | Admitting: *Deleted

## 2020-08-21 ENCOUNTER — Observation Stay (HOSPITAL_COMMUNITY)
Admission: AD | Admit: 2020-08-21 | Discharge: 2020-08-21 | Disposition: A | Discharge status: Home / Self Care | Payer: 59 | Attending: Obstetrics | Admitting: Obstetrics

## 2020-08-21 ENCOUNTER — Encounter (HOSPITAL_COMMUNITY): Payer: Self-pay | Admitting: Obstetrics and Gynecology

## 2020-08-21 ENCOUNTER — Encounter (HOSPITAL_COMMUNITY): Payer: Self-pay | Admitting: Anesthesiology

## 2020-08-21 DIAGNOSIS — Z3A38 38 weeks gestation of pregnancy: Secondary | ICD-10-CM | POA: Insufficient documentation

## 2020-08-21 DIAGNOSIS — Z20822 Contact with and (suspected) exposure to covid-19: Secondary | ICD-10-CM | POA: Insufficient documentation

## 2020-08-21 DIAGNOSIS — O4700 False labor before 37 completed weeks of gestation, unspecified trimester: Secondary | ICD-10-CM | POA: Diagnosis present

## 2020-08-21 DIAGNOSIS — O321XX0 Maternal care for breech presentation, not applicable or unspecified: Secondary | ICD-10-CM | POA: Diagnosis not present

## 2020-08-21 DIAGNOSIS — O329XX0 Maternal care for malpresentation of fetus, unspecified, not applicable or unspecified: Secondary | ICD-10-CM | POA: Diagnosis present

## 2020-08-21 LAB — CBC
HCT: 38 % (ref 36.0–46.0)
Hemoglobin: 12.2 g/dL (ref 12.0–15.0)
MCH: 29.4 pg (ref 26.0–34.0)
MCHC: 32.1 g/dL (ref 30.0–36.0)
MCV: 91.6 fL (ref 80.0–100.0)
Platelets: 183 10*3/uL (ref 150–400)
RBC: 4.15 MIL/uL (ref 3.87–5.11)
RDW: 14.7 % (ref 11.5–15.5)
WBC: 8.9 10*3/uL (ref 4.0–10.5)
nRBC: 0 % (ref 0.0–0.2)

## 2020-08-21 LAB — TYPE AND SCREEN
ABO/RH(D): O POS
Antibody Screen: NEGATIVE

## 2020-08-21 LAB — RPR: RPR Ser Ql: NONREACTIVE

## 2020-08-21 LAB — RESP PANEL BY RT-PCR (FLU A&B, COVID) ARPGX2
Influenza A by PCR: NEGATIVE
Influenza B by PCR: NEGATIVE
SARS Coronavirus 2 by RT PCR: NEGATIVE

## 2020-08-21 MED ORDER — ACETAMINOPHEN 325 MG PO TABS: 650.0000 mg | ORAL_TABLET | ORAL | Status: DC | PRN

## 2020-08-21 MED ORDER — PRENATAL MULTIVITAMIN CH: 1.0000 | ORAL_TABLET | Freq: Every day | ORAL | Status: DC

## 2020-08-21 MED ORDER — LACTATED RINGERS IV BOLUS: 500.0000 mL | Freq: Once | INTRAVENOUS | Status: DC

## 2020-08-21 MED ORDER — TERBUTALINE SULFATE 1 MG/ML IJ SOLN
INTRAMUSCULAR | Status: AC
  Filled 2020-08-21: qty 1

## 2020-08-21 MED ORDER — PANTOPRAZOLE SODIUM 40 MG PO PACK: 20.0000 mg | PACK | Freq: Every day | ORAL | Status: DC

## 2020-08-21 MED ORDER — DOCUSATE SODIUM 100 MG PO CAPS: 100.0000 mg | ORAL_CAPSULE | Freq: Every day | ORAL | Status: DC

## 2020-08-21 MED ORDER — INDOMETHACIN 25 MG PO CAPS: 25.0000 mg | ORAL_CAPSULE | Freq: Four times a day (QID) | ORAL | Status: DC

## 2020-08-21 MED ORDER — LACTATED RINGERS IV SOLN: INTRAVENOUS | Status: DC

## 2020-08-21 MED ORDER — CALCIUM CARBONATE ANTACID 500 MG PO CHEW: 2.0000 | CHEWABLE_TABLET | ORAL | Status: DC | PRN

## 2020-08-21 MED ORDER — ZOLPIDEM TARTRATE 5 MG PO TABS: 5.0000 mg | ORAL_TABLET | Freq: Every evening | ORAL | Status: DC | PRN

## 2020-08-21 MED ORDER — BETAMETHASONE SOD PHOS & ACET 6 (3-3) MG/ML IJ SUSP: 12.0000 mg | Freq: Once | INTRAMUSCULAR | Status: DC

## 2020-08-21 MED ORDER — TERBUTALINE SULFATE 1 MG/ML IJ SOLN: 0.2500 mg | Freq: Once | INTRAMUSCULAR | Status: DC

## 2020-08-21 MED ORDER — INDOMETHACIN 25 MG PO CAPS: 50.0000 mg | ORAL_CAPSULE | Freq: Once | ORAL | Status: DC

## 2020-08-21 NOTE — Progress Notes (Signed)
ECV started with both providers. Monitors removed for procedure. No terbutaline given per MD.

## 2020-08-21 NOTE — Progress Notes (Signed)
At [facility] for sono at MD request.   Position - R transverse with fetal spine superior. + FHM Placenta R posterolateral AFI subjectively wnl  Neta Mends, MSN, CNM 08/21/2020, 8:54 AM

## 2020-08-21 NOTE — Progress Notes (Signed)
ECV complete and successful. Monitors applied. Dr. Billy Coast wants FHR monitor before discharge.

## 2020-08-21 NOTE — Procedures (Signed)
Breech confirmed Reactive NST BP 135/77   Pulse 89   Temp 98 F (36.7 C) (Oral)   Resp 18  Forward roll performed without complications. Reactive NST post procedure. DC home with labor precautions.

## 2020-08-21 NOTE — H&P (Signed)
Pamela Aguirre is a 40 y.o. female presenting for ECV. OB History    Gravida  1   Para      Term      Preterm      AB      Living        SAB      IAB      Ectopic      Multiple      Live Births             Past Medical History:  Diagnosis Date  . Allergy    Past Surgical History:  Procedure Laterality Date  . NO PAST SURGERIES     Family History: family history includes Diabetes in her maternal aunt; Heart disease in her maternal grandmother; Hypertension in her maternal grandmother and mother. Social History:  reports that she has quit smoking. She has never used smokeless tobacco. She reports previous alcohol use. She reports that she does not use drugs.     Maternal Diabetes: No Genetic Screening: Normal Maternal Ultrasounds/Referrals: Normal Fetal Ultrasounds or other Referrals:  None Maternal Substance Abuse:  No Significant Maternal Medications:  None Significant Maternal Lab Results:  Group B Strep negative Other Comments:  None  Review of Systems  Constitutional: Negative.   All other systems reviewed and are negative.  Maternal Medical History:  Fetal activity: Perceived fetal activity is normal.   Last perceived fetal movement was within the past hour.    Prenatal Complications - Diabetes: none.      Blood pressure 131/75, pulse 89, temperature 98 F (36.7 C), temperature source Oral, resp. rate 18. Maternal Exam:  Uterine Assessment: Contraction strength is mild.  Contraction frequency is rare.   Abdomen: Patient reports no abdominal tenderness. Fetal presentation: breech  Introitus: Normal vulva. Normal vagina.  Ferning test: not done.  Nitrazine test: not done. Amniotic fluid character: not assessed.  Pelvis: questionable for delivery.      Physical Exam Constitutional:      Appearance: Normal appearance. She is normal weight.  HENT:     Head: Normocephalic.  Cardiovascular:     Rate and Rhythm: Regular rhythm.      Pulses: Normal pulses.     Heart sounds: Normal heart sounds.  Pulmonary:     Effort: Pulmonary effort is normal.     Breath sounds: Normal breath sounds.  Abdominal:     General: Bowel sounds are normal.     Palpations: Abdomen is soft.  Genitourinary:    General: Normal vulva.  Musculoskeletal:        General: Normal range of motion.     Cervical back: Normal range of motion and neck supple.  Skin:    General: Skin is warm and dry.  Neurological:     General: No focal deficit present.     Mental Status: She is alert and oriented to person, place, and time.  Psychiatric:        Mood and Affect: Mood normal.        Behavior: Behavior normal.     Prenatal labs: ABO, Rh:  pos Antibody:  neg Rubella:  imm RPR:   neg HBsAg:   neg HIV:   neg GBS:   neg  Assessment/Plan: 38 week IUP Breech ECV Risks vs benefits of procedure discussed. Consent done.    TAAVON,RICHARD J 08/21/2020, 8:14 AM

## 2020-08-30 ENCOUNTER — Telehealth (HOSPITAL_COMMUNITY): Payer: Self-pay | Admitting: *Deleted

## 2020-08-30 ENCOUNTER — Encounter (HOSPITAL_COMMUNITY): Payer: Self-pay | Admitting: *Deleted

## 2020-08-30 NOTE — Telephone Encounter (Signed)
Preadmission screen  

## 2020-08-31 ENCOUNTER — Other Ambulatory Visit: Payer: Self-pay | Admitting: Obstetrics and Gynecology

## 2020-09-01 ENCOUNTER — Other Ambulatory Visit (HOSPITAL_COMMUNITY): Payer: 59 | Attending: Obstetrics and Gynecology

## 2020-09-03 ENCOUNTER — Inpatient Hospital Stay (HOSPITAL_COMMUNITY)
Admission: AD | Admit: 2020-09-03 | Discharge: 2020-09-04 | DRG: 807 | Disposition: A | Discharge status: Home / Self Care | Payer: 59 | Attending: Obstetrics and Gynecology | Admitting: Obstetrics and Gynecology

## 2020-09-03 ENCOUNTER — Encounter (HOSPITAL_COMMUNITY): Payer: Self-pay | Admitting: Obstetrics and Gynecology

## 2020-09-03 ENCOUNTER — Inpatient Hospital Stay (HOSPITAL_COMMUNITY): Payer: 59 | Admitting: Anesthesiology

## 2020-09-03 ENCOUNTER — Other Ambulatory Visit: Payer: 59

## 2020-09-03 ENCOUNTER — Inpatient Hospital Stay (HOSPITAL_COMMUNITY): Payer: 59

## 2020-09-03 ENCOUNTER — Other Ambulatory Visit: Payer: Self-pay

## 2020-09-03 DIAGNOSIS — Z3490 Encounter for supervision of normal pregnancy, unspecified, unspecified trimester: Secondary | ICD-10-CM | POA: Diagnosis present

## 2020-09-03 DIAGNOSIS — Z87891 Personal history of nicotine dependence: Secondary | ICD-10-CM | POA: Diagnosis not present

## 2020-09-03 DIAGNOSIS — B951 Streptococcus, group B, as the cause of diseases classified elsewhere: Secondary | ICD-10-CM | POA: Diagnosis present

## 2020-09-03 DIAGNOSIS — O99824 Streptococcus B carrier state complicating childbirth: Secondary | ICD-10-CM | POA: Diagnosis present

## 2020-09-03 DIAGNOSIS — Z3A40 40 weeks gestation of pregnancy: Secondary | ICD-10-CM | POA: Diagnosis not present

## 2020-09-03 DIAGNOSIS — O480 Post-term pregnancy: Principal | ICD-10-CM | POA: Diagnosis present

## 2020-09-03 DIAGNOSIS — O99214 Obesity complicating childbirth: Secondary | ICD-10-CM | POA: Diagnosis present

## 2020-09-03 LAB — TYPE AND SCREEN
ABO/RH(D): O POS
Antibody Screen: NEGATIVE

## 2020-09-03 LAB — CBC
HCT: 36.7 % (ref 36.0–46.0)
Hemoglobin: 12.2 g/dL (ref 12.0–15.0)
MCH: 29.3 pg (ref 26.0–34.0)
MCHC: 33.2 g/dL (ref 30.0–36.0)
MCV: 88 fL (ref 80.0–100.0)
Platelets: 206 10*3/uL (ref 150–400)
RBC: 4.17 MIL/uL (ref 3.87–5.11)
RDW: 14.8 % (ref 11.5–15.5)
WBC: 10.5 10*3/uL (ref 4.0–10.5)
nRBC: 0 % (ref 0.0–0.2)

## 2020-09-03 LAB — RPR: RPR Ser Ql: NONREACTIVE

## 2020-09-03 MED ORDER — ONDANSETRON HCL 4 MG/2ML IJ SOLN: 4.0000 mg | INTRAMUSCULAR | Status: DC | PRN

## 2020-09-03 MED ORDER — METHYLERGONOVINE MALEATE 0.2 MG PO TABS: 0.2000 mg | ORAL_TABLET | ORAL | Status: DC | PRN

## 2020-09-03 MED ORDER — OXYTOCIN-SODIUM CHLORIDE 30-0.9 UT/500ML-% IV SOLN
1.0000 m[IU]/min | INTRAVENOUS | Status: DC
  Administered 2020-09-03: 2 m[IU]/min via INTRAVENOUS
  Filled 2020-09-03: qty 500

## 2020-09-03 MED ORDER — TRANEXAMIC ACID-NACL 1000-0.7 MG/100ML-% IV SOLN
INTRAVENOUS | Status: AC
  Administered 2020-09-03: 1000 mg
  Filled 2020-09-03: qty 100

## 2020-09-03 MED ORDER — DIPHENHYDRAMINE HCL 50 MG/ML IJ SOLN: 12.5000 mg | INTRAMUSCULAR | Status: DC | PRN

## 2020-09-03 MED ORDER — OXYTOCIN-SODIUM CHLORIDE 30-0.9 UT/500ML-% IV SOLN: 2.5000 [IU]/h | INTRAVENOUS | Status: DC

## 2020-09-03 MED ORDER — IBUPROFEN 600 MG PO TABS
600.0000 mg | ORAL_TABLET | Freq: Four times a day (QID) | ORAL | Status: DC
  Administered 2020-09-03 – 2020-09-04 (×4): 600 mg via ORAL
  Filled 2020-09-03 (×4): qty 1

## 2020-09-03 MED ORDER — OXYCODONE-ACETAMINOPHEN 5-325 MG PO TABS: 1.0000 | ORAL_TABLET | ORAL | Status: DC | PRN

## 2020-09-03 MED ORDER — PRENATAL MULTIVITAMIN CH
1.0000 | ORAL_TABLET | Freq: Every day | ORAL | Status: DC
  Administered 2020-09-04: 1 via ORAL
  Filled 2020-09-03: qty 1

## 2020-09-03 MED ORDER — SIMETHICONE 80 MG PO CHEW: 80.0000 mg | CHEWABLE_TABLET | ORAL | Status: DC | PRN

## 2020-09-03 MED ORDER — OXYTOCIN BOLUS FROM INFUSION
333.0000 mL | Freq: Once | INTRAVENOUS | Status: AC
  Administered 2020-09-03: 333 mL via INTRAVENOUS

## 2020-09-03 MED ORDER — DIPHENHYDRAMINE HCL 25 MG PO CAPS: 25.0000 mg | ORAL_CAPSULE | Freq: Four times a day (QID) | ORAL | Status: DC | PRN

## 2020-09-03 MED ORDER — TERBUTALINE SULFATE 1 MG/ML IJ SOLN: 0.2500 mg | Freq: Once | INTRAMUSCULAR | Status: DC | PRN

## 2020-09-03 MED ORDER — TRANEXAMIC ACID-NACL 1000-0.7 MG/100ML-% IV SOLN: 1000.0000 mg | INTRAVENOUS | Status: DC

## 2020-09-03 MED ORDER — COCONUT OIL OIL: 1.0000 "application " | TOPICAL_OIL | Status: DC | PRN

## 2020-09-03 MED ORDER — ACETAMINOPHEN 325 MG PO TABS: 650.0000 mg | ORAL_TABLET | ORAL | Status: DC | PRN

## 2020-09-03 MED ORDER — PHENYLEPHRINE 40 MCG/ML (10ML) SYRINGE FOR IV PUSH (FOR BLOOD PRESSURE SUPPORT): 80.0000 ug | PREFILLED_SYRINGE | INTRAVENOUS | Status: DC | PRN

## 2020-09-03 MED ORDER — METHYLERGONOVINE MALEATE 0.2 MG/ML IJ SOLN: 0.2000 mg | Freq: Once | INTRAMUSCULAR | Status: DC

## 2020-09-03 MED ORDER — LACTATED RINGERS IV SOLN
500.0000 mL | Freq: Once | INTRAVENOUS | Status: AC
  Administered 2020-09-03: 500 mL via INTRAVENOUS

## 2020-09-03 MED ORDER — EPHEDRINE 5 MG/ML INJ: 10.0000 mg | INTRAVENOUS | Status: DC | PRN

## 2020-09-03 MED ORDER — TRANEXAMIC ACID-NACL 1000-0.7 MG/100ML-% IV SOLN: 1000.0000 mg | Freq: Once | INTRAVENOUS | Status: DC | PRN

## 2020-09-03 MED ORDER — ZOLPIDEM TARTRATE 5 MG PO TABS: 5.0000 mg | ORAL_TABLET | Freq: Every evening | ORAL | Status: DC | PRN

## 2020-09-03 MED ORDER — METHYLERGONOVINE MALEATE 0.2 MG/ML IJ SOLN
INTRAMUSCULAR | Status: AC
  Administered 2020-09-03: 0.2 mg
  Filled 2020-09-03: qty 1

## 2020-09-03 MED ORDER — BENZOCAINE-MENTHOL 20-0.5 % EX AERO
1.0000 "application " | INHALATION_SPRAY | CUTANEOUS | Status: DC | PRN
  Administered 2020-09-04: 1 via TOPICAL
  Filled 2020-09-03: qty 56

## 2020-09-03 MED ORDER — LACTATED RINGERS IV SOLN: 500.0000 mL | Freq: Once | INTRAVENOUS | Status: DC

## 2020-09-03 MED ORDER — SENNOSIDES-DOCUSATE SODIUM 8.6-50 MG PO TABS
2.0000 | ORAL_TABLET | Freq: Every day | ORAL | Status: DC
  Administered 2020-09-04: 2 via ORAL
  Filled 2020-09-03: qty 2

## 2020-09-03 MED ORDER — ONDANSETRON HCL 4 MG/2ML IJ SOLN: 4.0000 mg | Freq: Four times a day (QID) | INTRAMUSCULAR | Status: DC | PRN

## 2020-09-03 MED ORDER — DIBUCAINE (PERIANAL) 1 % EX OINT: 1.0000 "application " | TOPICAL_OINTMENT | CUTANEOUS | Status: DC | PRN

## 2020-09-03 MED ORDER — WITCH HAZEL-GLYCERIN EX PADS: 1.0000 "application " | MEDICATED_PAD | CUTANEOUS | Status: DC | PRN

## 2020-09-03 MED ORDER — TETANUS-DIPHTH-ACELL PERTUSSIS 5-2.5-18.5 LF-MCG/0.5 IM SUSY: 0.5000 mL | PREFILLED_SYRINGE | Freq: Once | INTRAMUSCULAR | Status: DC

## 2020-09-03 MED ORDER — FENTANYL-BUPIVACAINE-NACL 0.5-0.125-0.9 MG/250ML-% EP SOLN
12.0000 mL/h | EPIDURAL | Status: DC | PRN
  Administered 2020-09-03: 12 mL/h via EPIDURAL
  Filled 2020-09-03: qty 250

## 2020-09-03 MED ORDER — SOD CITRATE-CITRIC ACID 500-334 MG/5ML PO SOLN: 30.0000 mL | ORAL | Status: DC | PRN

## 2020-09-03 MED ORDER — PENICILLIN G POT IN DEXTROSE 60000 UNIT/ML IV SOLN
3.0000 10*6.[IU] | INTRAVENOUS | Status: DC
  Administered 2020-09-03: 3 10*6.[IU] via INTRAVENOUS
  Filled 2020-09-03: qty 50

## 2020-09-03 MED ORDER — LACTATED RINGERS IV SOLN: INTRAVENOUS | Status: DC

## 2020-09-03 MED ORDER — LIDOCAINE HCL (PF) 1 % IJ SOLN
INTRAMUSCULAR | Status: DC | PRN
  Administered 2020-09-03: 2 mL via EPIDURAL
  Administered 2020-09-03: 5 mL via EPIDURAL
  Administered 2020-09-03: 3 mL via EPIDURAL

## 2020-09-03 MED ORDER — METHYLERGONOVINE MALEATE 0.2 MG/ML IJ SOLN: 0.2000 mg | INTRAMUSCULAR | Status: DC | PRN

## 2020-09-03 MED ORDER — ONDANSETRON HCL 4 MG PO TABS: 4.0000 mg | ORAL_TABLET | ORAL | Status: DC | PRN

## 2020-09-03 MED ORDER — LACTATED RINGERS IV SOLN: 500.0000 mL | INTRAVENOUS | Status: DC | PRN

## 2020-09-03 MED ORDER — LIDOCAINE HCL (PF) 1 % IJ SOLN: 30.0000 mL | INTRAMUSCULAR | Status: DC | PRN

## 2020-09-03 MED ORDER — SODIUM CHLORIDE 0.9 % IV SOLN
5.0000 10*6.[IU] | Freq: Once | INTRAVENOUS | Status: AC
  Administered 2020-09-03: 5 10*6.[IU] via INTRAVENOUS
  Filled 2020-09-03: qty 5

## 2020-09-03 MED ORDER — OXYCODONE-ACETAMINOPHEN 5-325 MG PO TABS: 2.0000 | ORAL_TABLET | ORAL | Status: DC | PRN

## 2020-09-03 NOTE — Anesthesia Preprocedure Evaluation (Signed)
Anesthesia Evaluation  Patient identified by MRN, date of birth, ID band Patient awake    Reviewed: Allergy & Precautions, NPO status , Patient's Chart, lab work & pertinent test results  Airway Mallampati: II  TM Distance: >3 FB Neck ROM: Full    Dental  (+) Teeth Intact, Dental Advisory Given   Pulmonary neg pulmonary ROS, former smoker,    Pulmonary exam normal breath sounds clear to auscultation       Cardiovascular negative cardio ROS Normal cardiovascular exam Rhythm:Regular Rate:Normal     Neuro/Psych negative neurological ROS     GI/Hepatic negative GI ROS, Neg liver ROS,   Endo/Other  Obesity   Renal/GU negative Renal ROS     Musculoskeletal negative musculoskeletal ROS (+)   Abdominal   Peds  Hematology negative hematology ROS (+) Plt 206k   Anesthesia Other Findings Day of surgery medications reviewed with the patient.  Reproductive/Obstetrics (+) Pregnancy                             Anesthesia Physical Anesthesia Plan  ASA: II  Anesthesia Plan: Epidural   Post-op Pain Management:    Induction:   PONV Risk Score and Plan: 2 and Treatment may vary due to age or medical condition  Airway Management Planned: Natural Airway  Additional Equipment:   Intra-op Plan:   Post-operative Plan:   Informed Consent: I have reviewed the patients History and Physical, chart, labs and discussed the procedure including the risks, benefits and alternatives for the proposed anesthesia with the patient or authorized representative who has indicated his/her understanding and acceptance.     Dental advisory given  Plan Discussed with:   Anesthesia Plan Comments: (Patient identified. Risks/Benefits/Options discussed with patient including but not limited to bleeding, infection, nerve damage, paralysis, failed block, incomplete pain control, headache, blood pressure changes, nausea,  vomiting, reactions to medication both or allergic, itching and postpartum back pain. Confirmed with bedside nurse the patient's most recent platelet count. Confirmed with patient that they are not currently taking any anticoagulation, have any bleeding history or any family history of bleeding disorders. Patient expressed understanding and wished to proceed. All questions were answered. )        Anesthesia Quick Evaluation

## 2020-09-03 NOTE — Lactation Note (Signed)
This note was copied from a baby's chart. Lactation Consultation Note  Patient Name: Pamela Aguirre [id number] Date: 09/03/2020 Reason for consult: L&D Initial assessment;Mother's request;Term Age:39 hours  LC assisted Mom latching infant in cradle on the left breast with signs of milk transfer. Mom to receive further LC support on the floor.   Maternal Data Has patient been taught Hand Expression?: Yes  Feeding Mother's Current Feeding Choice: Breast Milk  LATCH Score Latch: Repeated attempts needed to sustain latch, nipple held in mouth throughout feeding, stimulation needed to elicit sucking reflex.  Audible Swallowing: Spontaneous and intermittent  Type of Nipple: Everted at rest and after stimulation  Comfort (Breast/Nipple): Soft / non-tender  Hold (Positioning): Assistance needed to correctly position infant at breast and maintain latch.  LATCH Score: 8   Lactation Tools Discussed/Used    Interventions Interventions: Breast feeding basics reviewed;Breast compression;Assisted with latch;Adjust position;Skin to skin;Breast massage;Hand express;Expressed milk;Education  Discharge    Consult Status Consult Status: Follow-up Date: 09/04/20 Follow-up type: In-patient    Pamela Prinsen  Aguirre 09/03/2020, 6:32 PM

## 2020-09-03 NOTE — H&P (Signed)
Pamela Aguirre is a 40 y.o. female presenting for postdates IOL. OB History    Gravida  3   Para  1   Term  1   Preterm      AB  1   Living  1     SAB  1   IAB      Ectopic      Multiple      Live Births  1          Past Medical History:  Diagnosis Date  . Allergy   . Medical history non-contributory    Past Surgical History:  Procedure Laterality Date  . NO PAST SURGERIES     Family History: family history includes Diabetes in her maternal aunt; Heart disease in her maternal grandmother; Hypertension in her maternal grandmother and mother. Social History:  reports that she has quit smoking. She has never used smokeless tobacco. She reports previous alcohol use. She reports that she does not use drugs.     Maternal Diabetes: No Genetic Screening: Normal Maternal Ultrasounds/Referrals: Normal Fetal Ultrasounds or other Referrals:  None Maternal Substance Abuse:  No Significant Maternal Medications:  None Significant Maternal Lab Results:  Group B Strep positive Other Comments:  None  Review of Systems  Constitutional: Negative.   All other systems reviewed and are negative.  Maternal Medical History:  Reason for admission: Contractions.   Contractions: Frequency: rare.   Perceived severity is mild.    Fetal activity: Perceived fetal activity is normal.    Prenatal complications: no prenatal complications Prenatal Complications - Diabetes: none.    Dilation: 2.5 Effacement (%): 50 Station: -2 Exam by:: Venda Rodes, RN Blood pressure 138/74, pulse 74, temperature 98.1 F (36.7 C), temperature source Oral, height 5\' 6"  (1.676 m), weight 110.7 kg. Maternal Exam:  Uterine Assessment: Contraction strength is mild.  Contraction frequency is rare.   Abdomen: Patient reports no abdominal tenderness. Fetal presentation: vertex  Introitus: Normal vulva. Normal vagina.  Ferning test: not done.  Nitrazine test: not done. Amniotic fluid character:  not assessed.  Pelvis: questionable for delivery.   Cervix: Cervix evaluated by digital exam.     Physical Exam Constitutional:      Appearance: Normal appearance. She is normal weight.  HENT:     Head: Normocephalic and atraumatic.  Cardiovascular:     Rate and Rhythm: Normal rate and regular rhythm.     Pulses: Normal pulses.     Heart sounds: Normal heart sounds.  Pulmonary:     Effort: Pulmonary effort is normal.     Breath sounds: Normal breath sounds.  Abdominal:     Palpations: Abdomen is soft.  Genitourinary:    General: Normal vulva.  Musculoskeletal:        General: Normal range of motion.     Cervical back: Normal range of motion and neck supple.  Skin:    General: Skin is warm and dry.  Neurological:     General: No focal deficit present.     Mental Status: She is alert and oriented to person, place, and time.  Psychiatric:        Mood and Affect: Mood normal.        Behavior: Behavior normal.     Prenatal labs: ABO, Rh: --/--/PENDING (03/28 0940) Antibody: PENDING (03/28 0940) Rubella: Immune (09/01 0000) RPR: NON REACTIVE (03/28 0920)  HBsAg: Negative (09/01 0000)  HIV: Non-reactive (09/01 0000)  GBS: Positive/-- (02/18 0000)   Assessment/Plan: 40+ wk IUP Postterm  IOL GBS pos IV abx Pitocin.   Brennan Litzinger J 09/03/2020, 12:37 PM

## 2020-09-03 NOTE — Anesthesia Procedure Notes (Signed)
Epidural Patient location during procedure: OB Start time: 09/03/2020 2:34 PM End time: 09/03/2020 2:41 PM  Staffing Anesthesiologist: Cecile Hearing, MD Performed: anesthesiologist   Preanesthetic Checklist Completed: patient identified, IV checked, risks and benefits discussed, monitors and equipment checked, pre-op evaluation and timeout performed  Epidural Patient position: sitting Prep: DuraPrep Patient monitoring: blood pressure and continuous pulse ox Approach: midline Location: L3-L4 Injection technique: LOR air  Needle:  Needle type: Tuohy  Needle gauge: 17 G Needle length: 9 cm Needle insertion depth: 7 cm Catheter size: 19 Gauge Catheter at skin depth: 12 cm Test dose: negative and Other (1% Lidocaine)  Additional Notes Patient identified.  Risk benefits discussed including failed block, incomplete pain control, headache, nerve damage, paralysis, blood pressure changes, nausea, vomiting, reactions to medication both toxic or allergic, and postpartum back pain.  Patient expressed understanding and wished to proceed.  All questions were answered.  Sterile technique used throughout procedure and epidural site dressed with sterile barrier dressing. No paresthesia or other complications noted. The patient did not experience any signs of intravascular injection such as tinnitus or metallic taste in mouth nor signs of intrathecal spread such as rapid motor block. Please see nursing notes for vital signs. Reason for block:procedure for pain

## 2020-09-04 LAB — CBC
HCT: 36.2 % (ref 36.0–46.0)
Hemoglobin: 11.7 g/dL — ABNORMAL LOW (ref 12.0–15.0)
MCH: 29.4 pg (ref 26.0–34.0)
MCHC: 32.3 g/dL (ref 30.0–36.0)
MCV: 91 fL (ref 80.0–100.0)
Platelets: 190 10*3/uL (ref 150–400)
RBC: 3.98 MIL/uL (ref 3.87–5.11)
RDW: 14.7 % (ref 11.5–15.5)
WBC: 14.6 10*3/uL — ABNORMAL HIGH (ref 4.0–10.5)
nRBC: 0 % (ref 0.0–0.2)

## 2020-09-04 MED ORDER — ACETAMINOPHEN 500 MG PO TABS: 1000.0000 mg | ORAL_TABLET | Freq: Four times a day (QID) | ORAL | 2 refills | Status: AC | PRN

## 2020-09-04 MED ORDER — IBUPROFEN 600 MG PO TABS: 600.0000 mg | ORAL_TABLET | Freq: Four times a day (QID) | ORAL | 0 refills | Status: DC

## 2020-09-04 MED ORDER — BENZOCAINE-MENTHOL 20-0.5 % EX AERO: 1.0000 "application " | INHALATION_SPRAY | CUTANEOUS | Status: DC | PRN

## 2020-09-04 MED ORDER — COCONUT OIL OIL: 1.0000 "application " | TOPICAL_OIL | 0 refills | Status: DC | PRN

## 2020-09-04 NOTE — Discharge Summary (Signed)
OB Discharge Summary  Patient Name: Pamela Aguirre DOB: 1980-11-08 MRN: [id number]  Date of admission: 09/03/2020 Delivering provider: Olivia Mackie   Admitting diagnosis: Encounter for induction of labor [Z34.90] Intrauterine pregnancy: [redacted]w[redacted]d     Secondary diagnosis: Patient Active Problem List   Diagnosis Date Noted  . Perineal laceration, second degree 09/04/2020  . Encounter for induction of labor 09/03/2020  . SVD 3/28 09/03/2020  . Postpartum care following vaginal delivery 3/28 09/03/2020   Additional problems:labile blood pressure   Date of discharge: 09/04/2020   Discharge diagnosis: Principal Problem:   Postpartum care following vaginal delivery 3/28 Active Problems:   Encounter for induction of labor   SVD 3/28   Perineal laceration, second degree                                                              Post partum procedures:none  Augmentation: AROM and Pitocin Pain control: Epidural  Laceration:2nd degree  Episiotomy:None  Complications: None  Hospital course:  Induction of Labor With Vaginal Delivery   40 y.o. yo [id number] at [redacted]w[redacted]d was admitted to the hospital 09/03/2020 for induction of labor.  Indication for induction: Postdates.  Patient had an uncomplicated labor course as follows: Membrane Rupture Time/Date: 1:23 PM ,09/03/2020   Delivery Method:Vaginal, Spontaneous  Episiotomy: None  Lacerations:  2nd degree  Details of delivery can be found in separate delivery note.  Patient had a routine postpartum course. Patient is discharged home 09/04/20.  Newborn Data: Birth date:09/03/2020  Birth time:5:33 PM  Gender:Female  Living status:Living  Apgars:8 ,9  [id number] g   Physical exam  Vitals:   09/03/20 2205 09/04/20 0140 09/04/20 0550 09/04/20 0945  BP: 120/80 (!) 148/77 132/70 121/73  Pulse: 91 88 82 75  Resp: 18 18 18 18   Temp: 98.8 F (37.1 C) 98.5 F (36.9 C) 98.5 F (36.9 C) 98 F (36.7 C)  TempSrc: Oral Oral Oral Oral  SpO2: 97% 98%  98%   Weight:      Height:       General: alert, cooperative and no distress Lochia: appropriate Uterine Fundus: firm Incision: N/A Perineum: repair intact, no edema DVT Evaluation: No cords or calf tenderness. No significant calf/ankle edema. Labs: Lab Results  Component Value Date   WBC 14.6 (H) 09/04/2020   HGB 11.7 (L) 09/04/2020   HCT 36.2 09/04/2020   MCV 91.0 09/04/2020   PLT 190 09/04/2020    Discharge instruction:  per After Visit Summary,  09/06/2020 OB booklet and  "Understanding Mother & Baby Care" hospital booklet  After Visit Meds:  Allergies as of 09/04/2020      Reactions   Lactose Intolerance (gi)       Medication List    TAKE these medications   acetaminophen 500 MG tablet Commonly known as: TYLENOL Take 2 tablets (1,000 mg total) by mouth every 6 (six) hours as needed.   benzocaine-Menthol 20-0.5 % Aero Commonly known as: DERMOPLAST Apply 1 application topically as needed for irritation (perineal discomfort).   coconut oil Oil Apply 1 application topically as needed.   ibuprofen 600 MG tablet Commonly known as: ADVIL Take 1 tablet (600 mg total) by mouth every 6 (six) hours.   prenatal multivitamin Tabs tablet Take 1 tablet by mouth at bedtime.  Discharge Care Instructions  (From admission, onward)         Start     Ordered   09/04/20 0000  Discharge wound care:       Comments: Sitz baths 2 times /day with warm water x 1 week. May add herbals: 1 ounce dried comfrey leaf* 1 ounce calendula flowers 1 ounce lavender flowers  Supplies can be found online at [employer] sources at [employer], [employer]  1/2 ounce dried uva ursi leaves 1/2 ounce witch hazel blossoms (if you can find them) 1/2 ounce dried sage leaf 1/2 cup sea salt Directions: Bring 2 quarts of water to a boil. Turn off heat, and place 1 ounce (approximately 1 large handful) of the above mixed herbs (not the salt) into the pot. Steep,  covered, for 30 minutes.  Strain the liquid well with a fine mesh strainer, and discard the herb material. Add 2 quarts of liquid to the tub, along with the 1/2 cup of salt. This medicinal liquid can also be made into compresses and peri-rinses.   09/04/20 1203          Diet: routine diet  Activity: Advance as tolerated. Pelvic rest for 6 weeks.   Postpartum contraception: Not Discussed  Newborn Data: Live born female  Birth Weight: 8 lb 15 oz (4054 g) APGAR: 8, 9  Newborn Delivery   Birth date/time: 09/03/2020 17:33:00 Delivery type: Vaginal, Spontaneous      named Thadeus Baby Feeding: Breast Disposition:home with mother   Delivery Report:  Review the Delivery Report for details.    Follow up:  Follow-up Information    Olivia Mackie, MD. Schedule an appointment as soon as possible for a visit in 6 week(s).   Specialty: Obstetrics and Gynecology Why: [facility] home nursin visit in 1 week for BP check. Contact information: [address] [place] [address] [phone]                 Signed: Neta Mends, CNM, MSN 09/04/2020, 12:07 PM

## 2020-09-04 NOTE — Discharge Instructions (Signed)
Lactation outpatient support - home visit  Linda Coppola RN, MHA, IBCLC at [facility]: Lactation Consultant  [email or web address] Mail: [email or web address] Tel: [phone]    Additional resources:  [facility] [email or web address]   Chiropractic specialist   Dr. Leanna Hastings [email or web address]  Craniosacral therapy for baby  Erin Balkind  [email or web address]  

## 2020-09-04 NOTE — Progress Notes (Addendum)
PPD # 1 S/P NSVD  Live born female  Birth Weight: 8 lb 15 oz (4054 g) APGAR: 8, 9  Newborn Delivery   Birth date/time: 09/03/2020 17:33:00 Delivery type: Vaginal, Spontaneous     Baby name: Waylan Rocher Delivering provider: Olivia Mackie  Episiotomy:None   Lacerations:2nd degree   Desires circumcision   Feeding: breast  Pain control at delivery: Epidural   S:  Reports feeling well. Experiencing mild Discomfort while sitting or laying in bed.            Tolerating po/ No nausea or vomiting noted.              Bleeding is light             Pain controlled with Ibuprofen and Tylenol              Up ad lib / ambulatory / voiding without difficulties   O:  A & O x 3, in no apparent distress              VS:  Vitals:   09/03/20 2040 09/03/20 2205 09/04/20 0140 09/04/20 0550  BP: (!) 146/67 120/80 (!) 148/77 132/70  Pulse: 98 91 88 82  Resp: 18 18 18 18   Temp: 97.6 F (36.4 C) 98.8 F (37.1 C) 98.5 F (36.9 C) 98.5 F (36.9 C)  TempSrc: Oral Oral Oral Oral  SpO2: 98% 97% 98% 98%  Weight:      Height:        LABS:  Recent Labs    09/03/20 0920 09/04/20 0527  WBC 10.5 14.6*  HGB 12.2 11.7*  HCT 36.7 36.2  PLT 206 190    Blood type: --/--/O POS (03/28 0940)  Rubella: Immune (09/01 0000)   Rubella: Immune (09/01 0000)  RPR: NON REACTIVE (03/28 0920)   HBsAg: Negative (09/01 0000)   HIV: Non-reactive (09/01 0000)   GBS: Positive/-- (02/18 0000)    Vaccines: TDaP          Declined         Flu             Declined                    COVID-19 Declined   Gen: AAO x 3, NAD  Abdomen: soft, non-tender, non-distended.              Fundus: firm, non-tender, 2 below umbilicus.   Perineum: Approximated 2nd degree lac repair. Mild swelling noted. Mild No erythema.   Lochia: Scant rubra, no clots noted.   Extremities: Mild non pitting edema present bilaterally. No calf pain or tenderness, no chords or sx of DVT.     A/P: PPD #1 11-[birth year]1975  40 y.o., 24    Principal Problem:   Postpartum care following vaginal delivery 3/28  -Routine PP care. Continue to manage pain with 800mg  of PO Ibuprofen and 650mg  PO Tylenol PRN. Discussed use of ice packs and ice warm sitz baths to help with discomfort. Encouraged ambulation and warm liquids to increase gut motility.   Active Problems:   Encounter for induction of labor  -SVD of viable Female infant  09/03/20.   Positive GBS test  -Treated with IV Penicillin x2 for GBS Prophylaxis.      Doing well - stable status.    Routine post partum orders  Anticipate discharge tomorrow    Pamela Aguirre ) , BSN, RNC-OB  Student Nurse-Midwife   09/04/2020  8:16 AM

## 2020-09-04 NOTE — Anesthesia Postprocedure Evaluation (Signed)
Anesthesia Post Note  Patient: Pamela Aguirre  Procedure(s) Performed: AN AD HOC LABOR EPIDURAL     Patient location during evaluation: Mother Baby Anesthesia Type: Epidural Level of consciousness: awake, awake and alert and oriented Pain management: pain level controlled Vital Signs Assessment: post-procedure vital signs reviewed and stable Respiratory status: spontaneous breathing and respiratory function stable Cardiovascular status: blood pressure returned to baseline Postop Assessment: no headache, epidural receding, patient able to bend at knees, adequate PO intake, no backache, no apparent nausea or vomiting and able to ambulate Anesthetic complications: no   No complications documented.  Last Vitals:  Vitals:   09/04/20 0140 09/04/20 0550  BP: (!) 148/77 132/70  Pulse: 88 82  Resp: 18 18  Temp: 36.9 C 36.9 C  SpO2: 98% 98%    Last Pain:  Vitals:   09/04/20 0550  TempSrc: Oral  PainSc: 5    Pain Goal:                   Cleda Clarks

## 2021-03-18 IMAGING — DX DG LUMBAR SPINE COMPLETE 4+V
5 series · 5 of 5 positions shown · non-contrast
Comparison: None.

CLINICAL DATA: Numbness and tingling down right leg for 1 year, no
history of injury.

EXAM:
LUMBAR SPINE - COMPLETE 4+ VIEW

[l-spine ap]
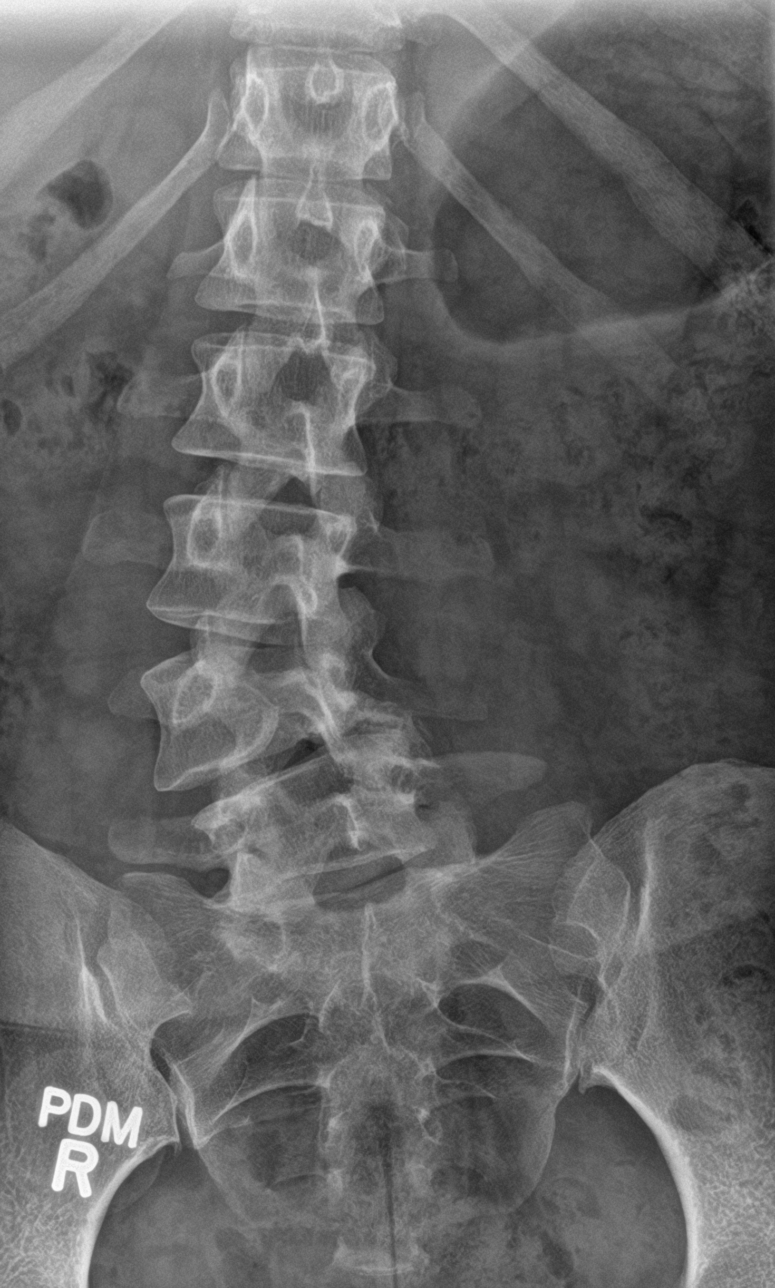

[l-spine obl (1 of 2)]
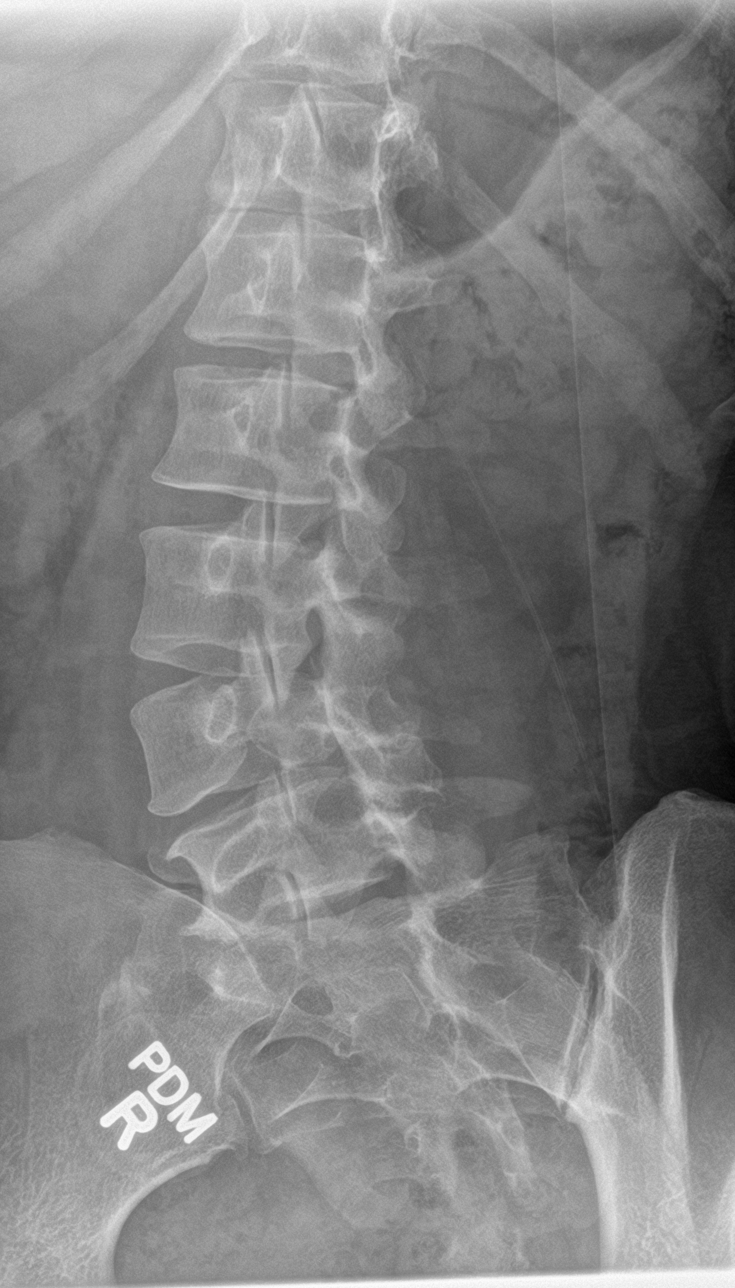

[l-spine obl (2 of 2)]
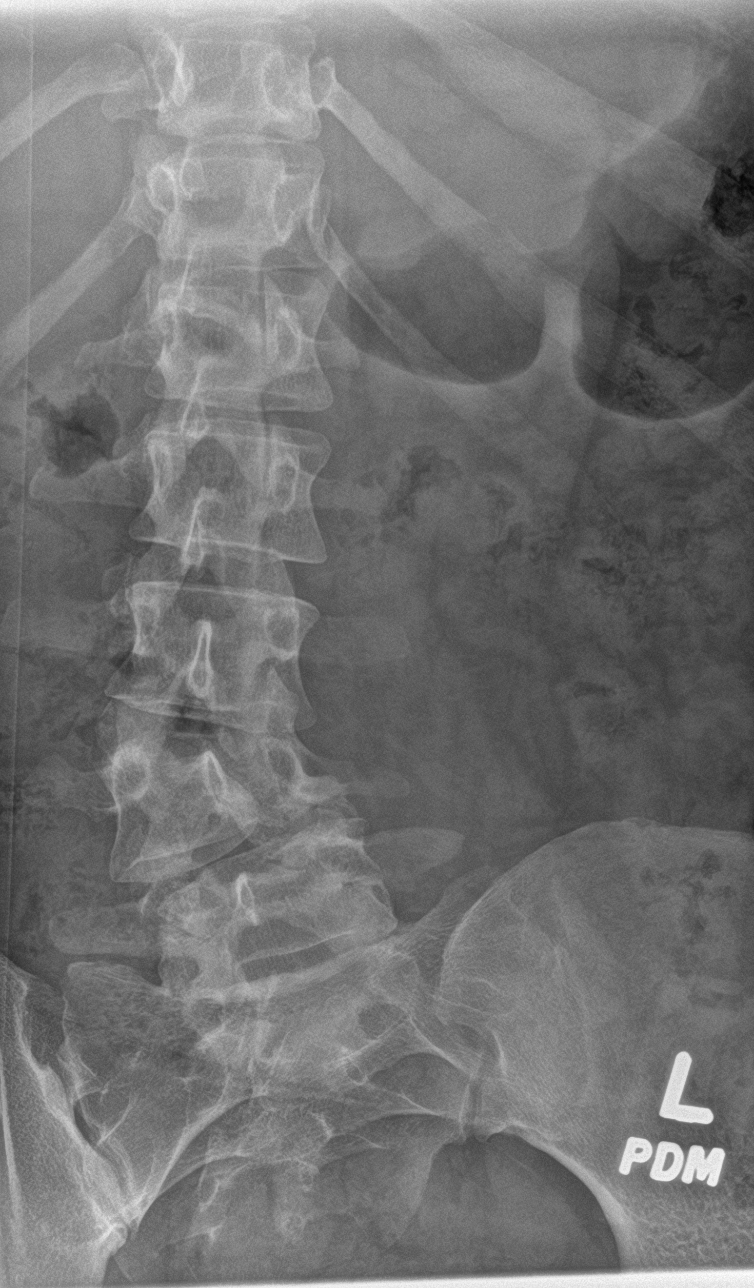

[l-spine lat]
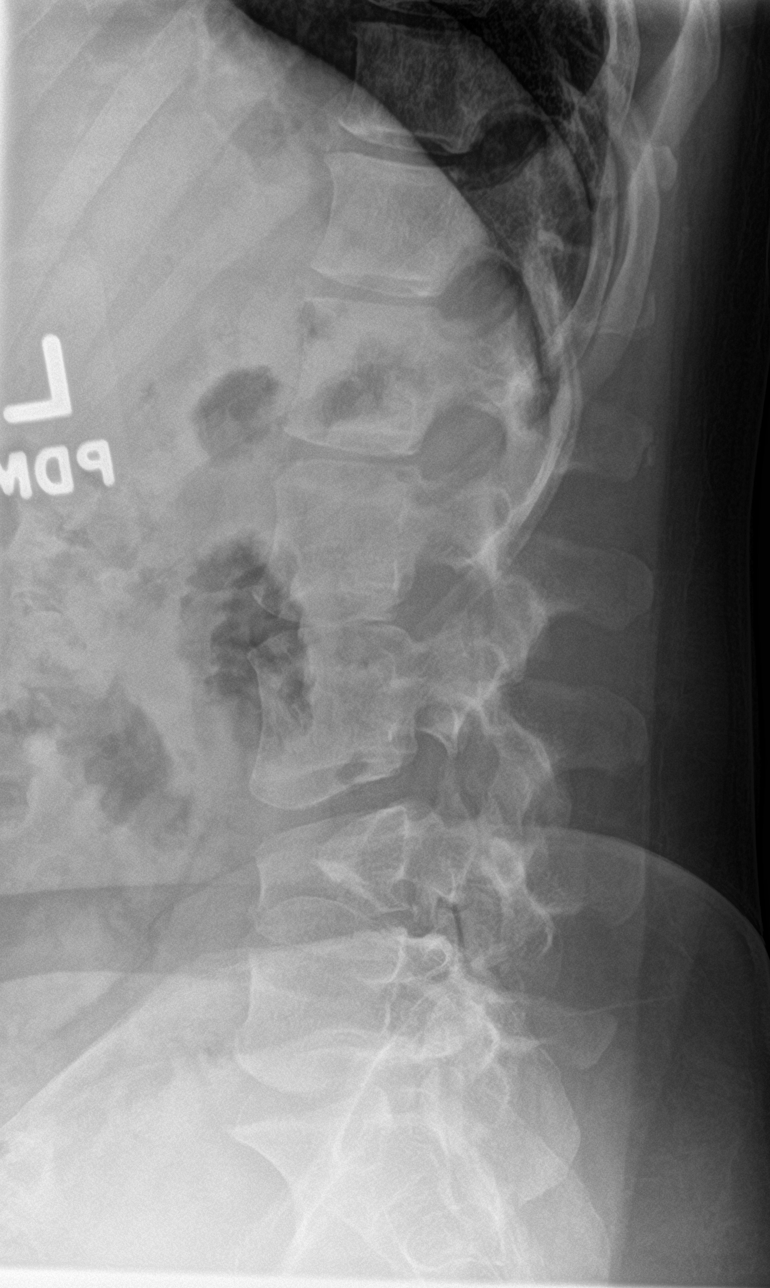

[l-spine spot]
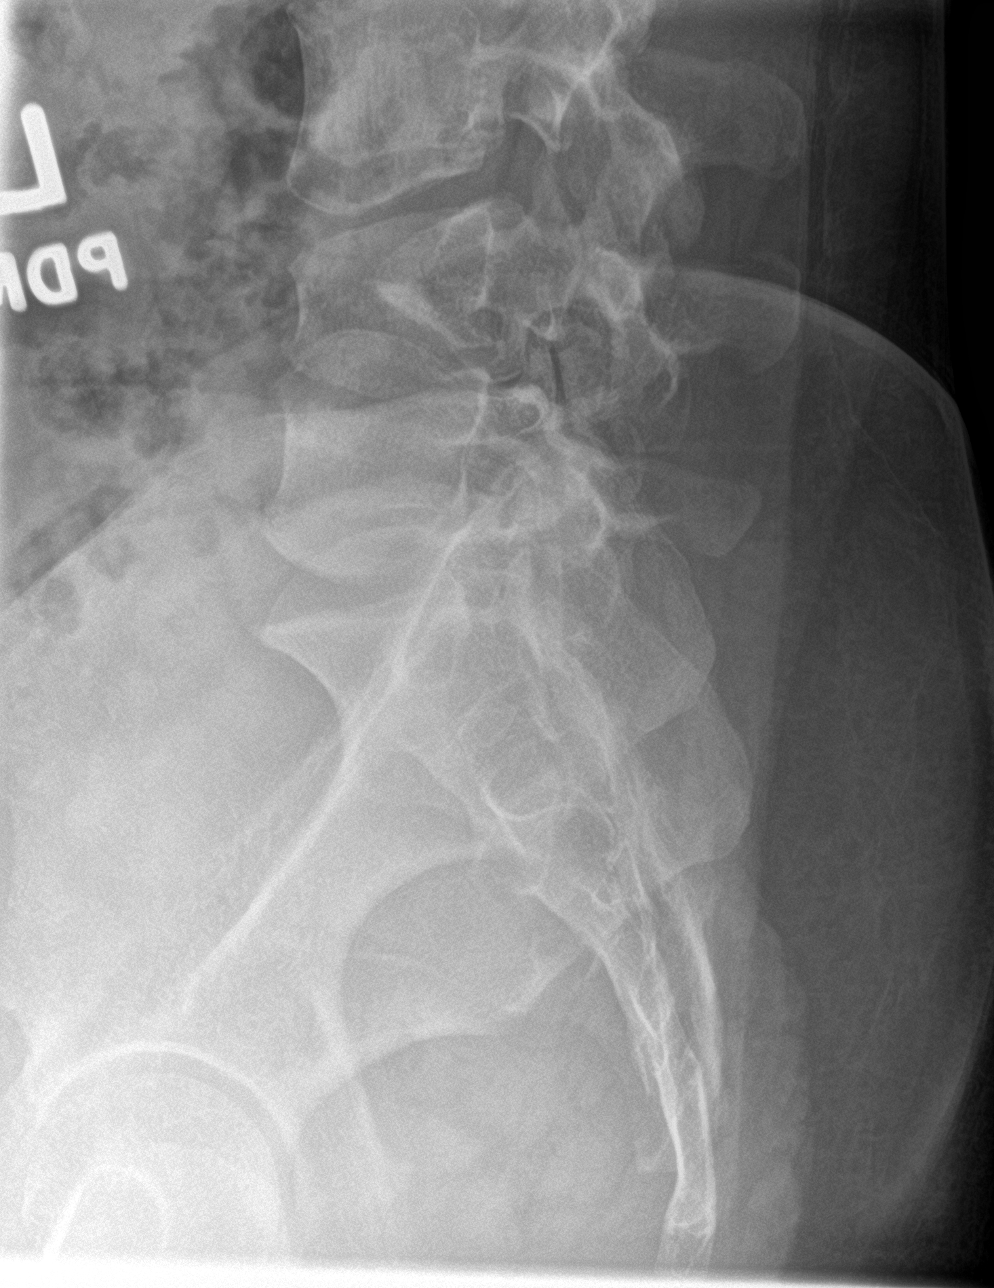

[5 of 5 positions shown; findings below may reference images not displayed]

FINDINGS: Hemivertebra at the L4 level with dextroconvexity away from the
hypoplasticw left side of the vertebral body. Disc space between L
3-4 and L4-5. Degenerative changes in the posterior elements.
Approximately 15-16 degrees of curvature related to this congenital
vertebral abnormality. Right lateral listhesis of L4 on L5
approximately 8 mm
IMPRESSION: Congenital vertebral abnormality with spinal curvature and some
right listhesis of L4 on L5 approximately 8 mm. This may explain
patient's symptoms and would be better evaluated utilizing MRI.

## 2021-03-18 IMAGING — DX DG FOOT COMPLETE 3+V*R*
3 series · 3 of 3 positions shown · non-contrast
Comparison: None.

CLINICAL DATA: Right foot pain, numbness and tingling to right foot
for 1 year.

EXAM:
RIGHT FOOT COMPLETE - 3+ VIEW

[foot ap]
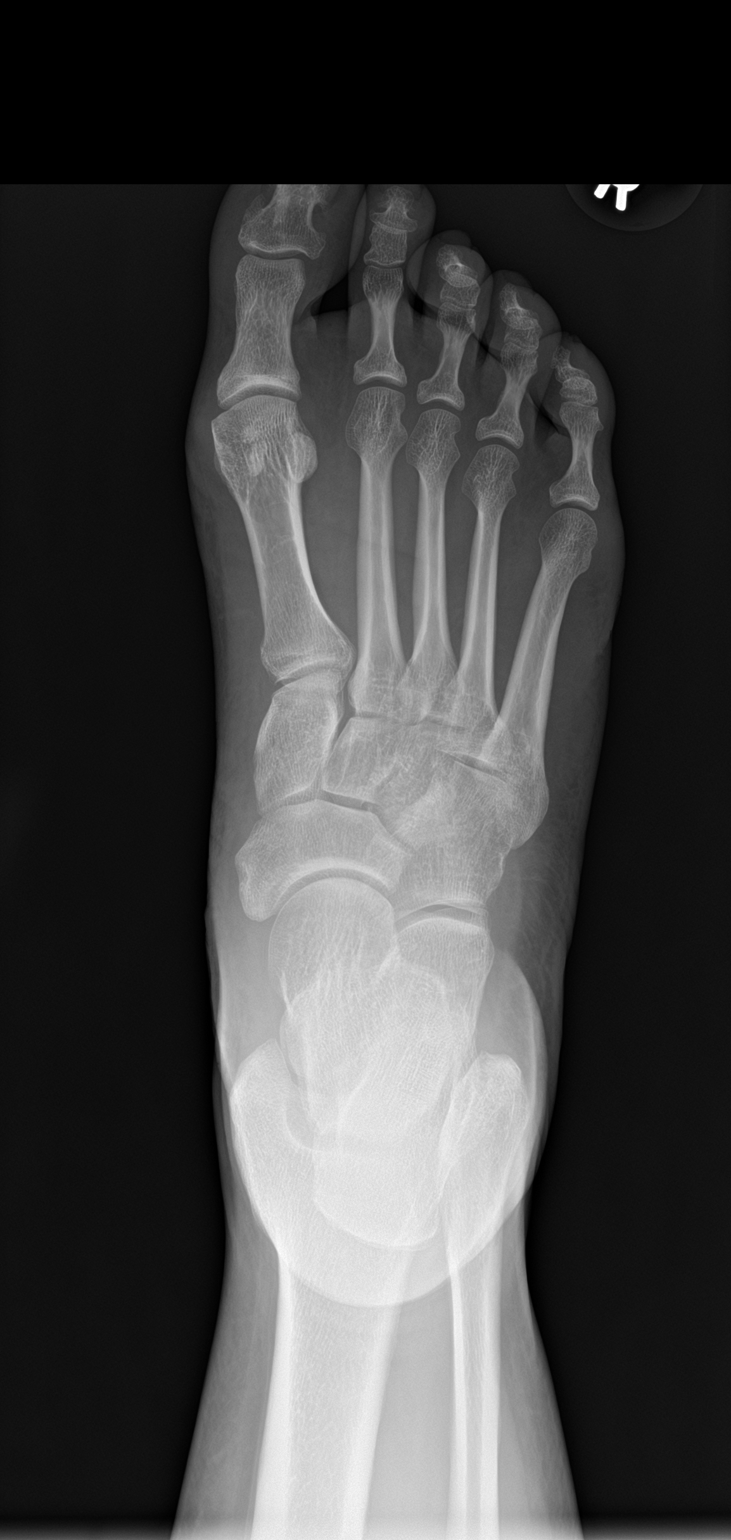

[foot obl]
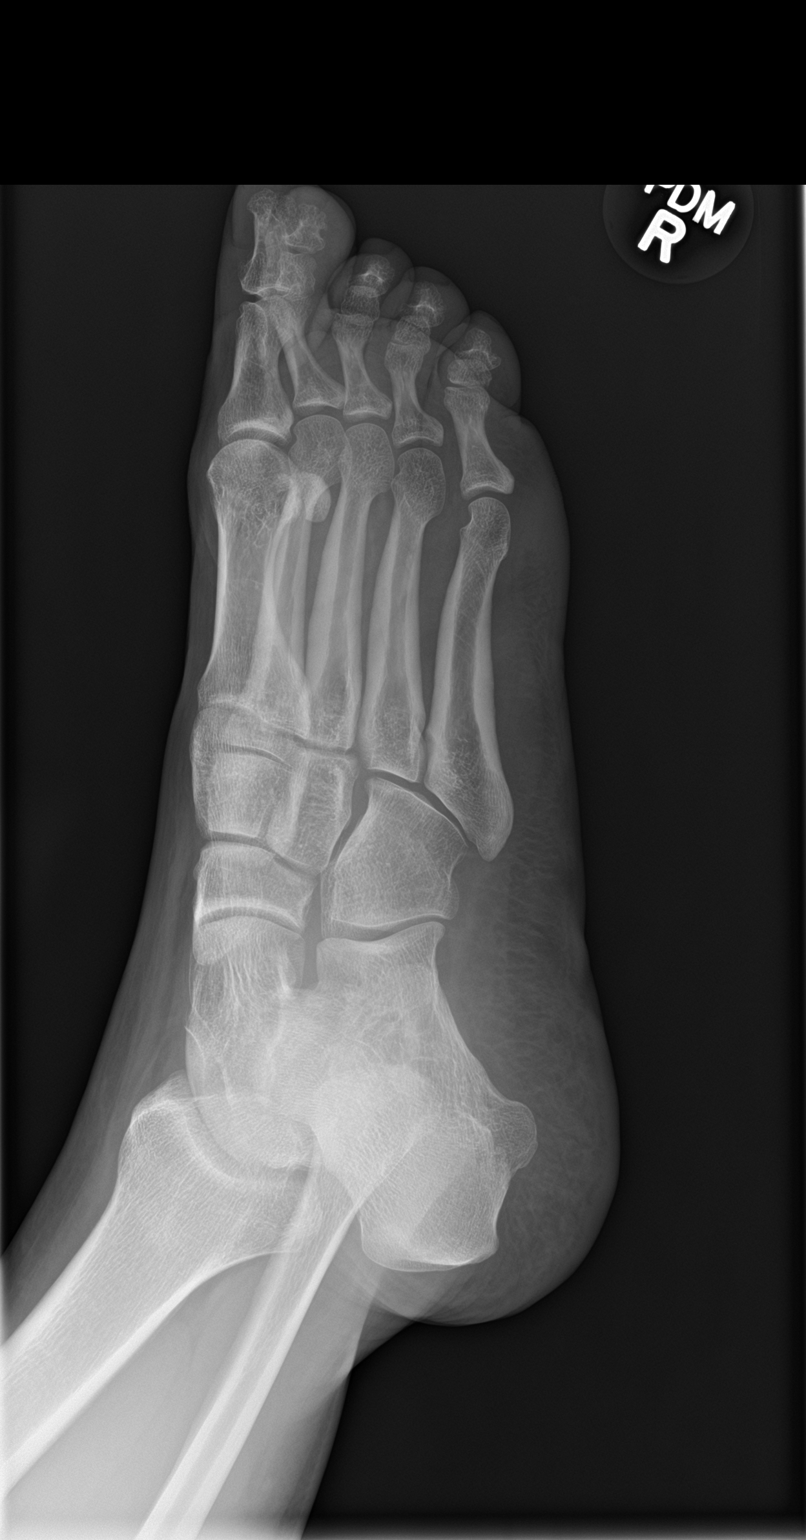

[foot lat]
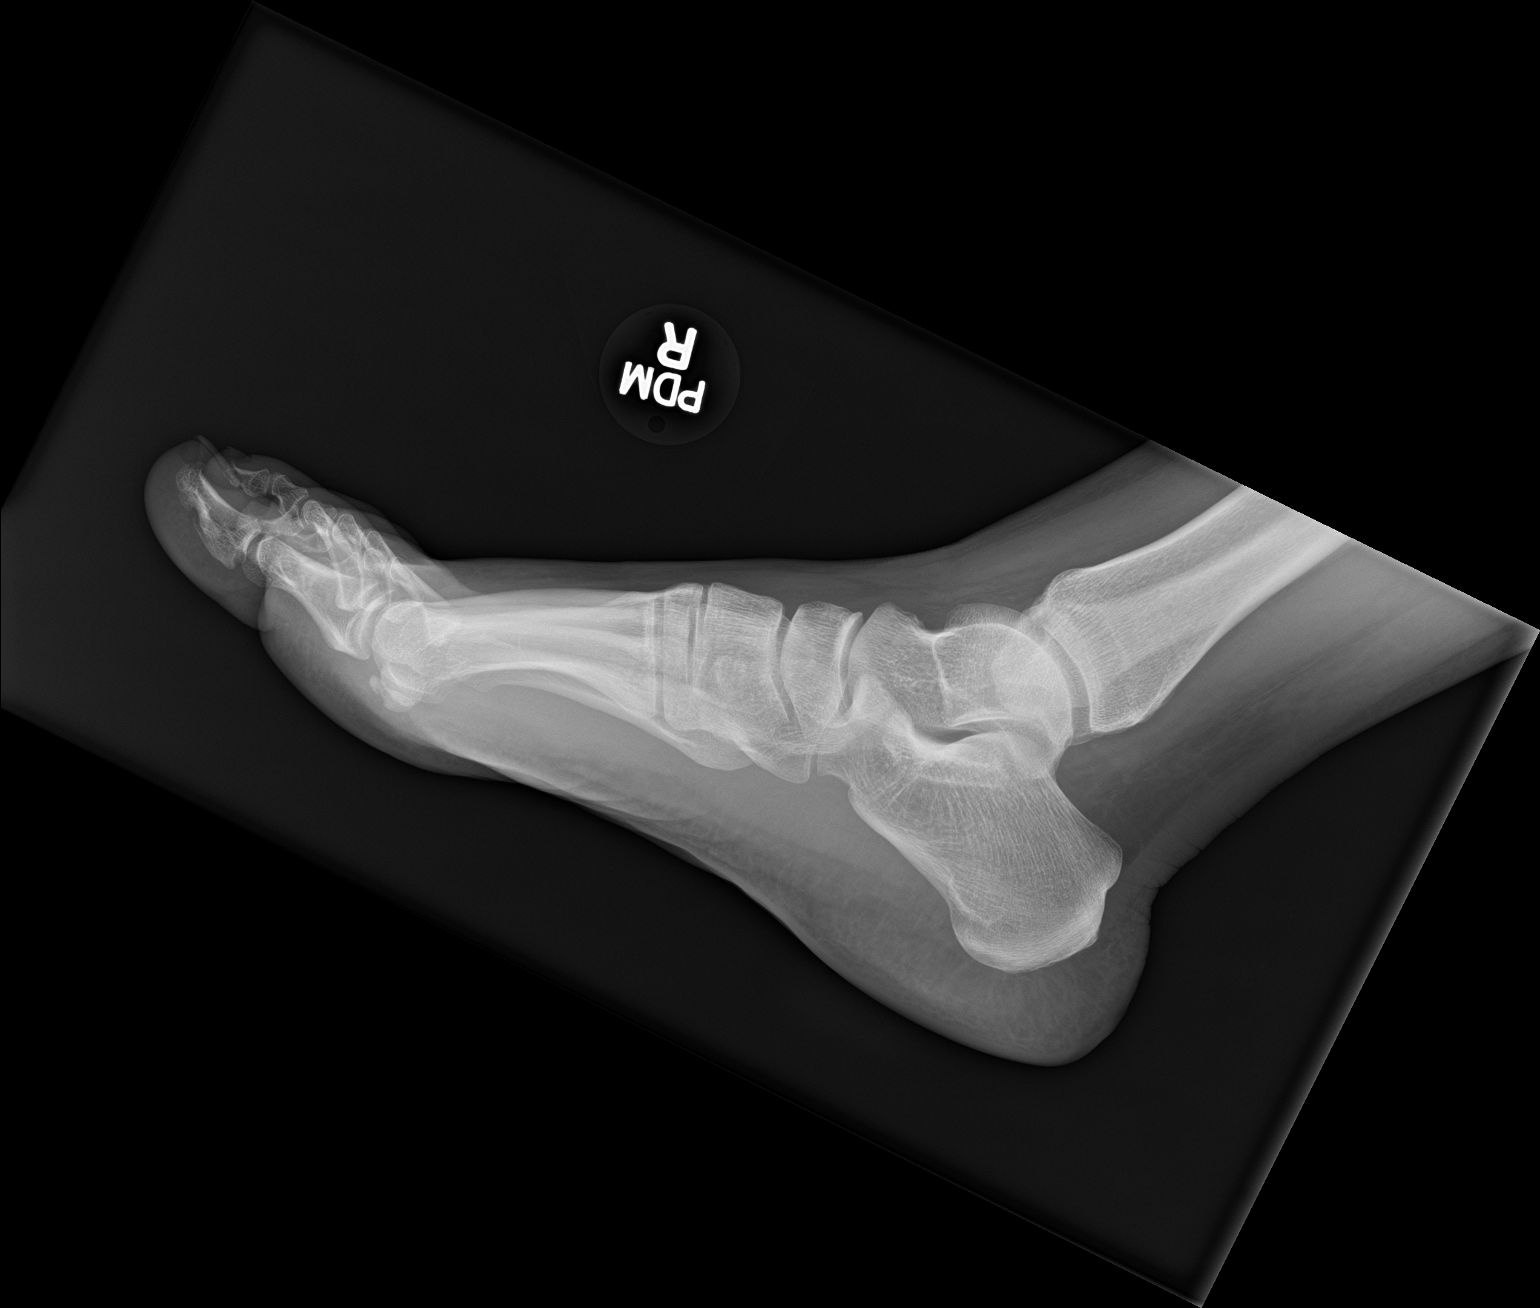

[3 of 3 positions shown; findings below may reference images not displayed]

FINDINGS: There is no evidence of fracture or dislocation. There is no
evidence of arthropathy or other focal bone abnormality. Soft
tissues are unremarkable.
IMPRESSION: Negative.

## 2021-11-01 ENCOUNTER — Ambulatory Visit: Payer: 59 | Admitting: Internal Medicine

## 2021-11-01 ENCOUNTER — Encounter: Payer: Self-pay | Admitting: Internal Medicine

## 2021-11-01 DIAGNOSIS — H1032 Unspecified acute conjunctivitis, left eye: Secondary | ICD-10-CM

## 2021-11-01 DIAGNOSIS — H6692 Otitis media, unspecified, left ear: Secondary | ICD-10-CM | POA: Insufficient documentation

## 2021-11-01 DIAGNOSIS — H6592 Unspecified nonsuppurative otitis media, left ear: Secondary | ICD-10-CM | POA: Diagnosis not present

## 2021-11-01 DIAGNOSIS — J039 Acute tonsillitis, unspecified: Secondary | ICD-10-CM

## 2021-11-01 DIAGNOSIS — H109 Unspecified conjunctivitis: Secondary | ICD-10-CM | POA: Insufficient documentation

## 2021-11-01 MED ORDER — AMOXICILLIN-POT CLAVULANATE 875-125 MG PO TABS: 1.0000 | ORAL_TABLET | Freq: Two times a day (BID) | ORAL | 0 refills | Status: DC

## 2021-11-01 MED ORDER — FLUCONAZOLE 150 MG PO TABS
150.0000 mg | ORAL_TABLET | Freq: Once | ORAL | 0 refills | Status: AC
Start: 2021-11-01 — End: 2021-11-01

## 2021-11-01 MED ORDER — POLYMYXIN B-TRIMETHOPRIM 10000-0.1 UNIT/ML-% OP SOLN: 1.0000 [drp] | OPHTHALMIC | 0 refills | Status: DC

## 2021-11-01 NOTE — Patient Instructions (Addendum)
       Medications changes include :   Augmentin and antibacterial eye drop.  Fluconazole if needed for yeast infection.   Your prescription(s) have been sent to your pharmacy.     Return if symptoms worsen or fail to improve.

## 2021-11-01 NOTE — Assessment & Plan Note (Signed)
Acute Given other symptoms concern for bacterial cause Start Polytrim eyedrops every 4 hours

## 2021-11-01 NOTE — Assessment & Plan Note (Signed)
Acute  Swollen tonsils with exudate on exam-erythematous Concern for bacterial tonsillitis Start Augmentin 875-125 mg twice daily x10 days

## 2021-11-01 NOTE — Assessment & Plan Note (Signed)
Acute Left TM is erythematous and she is experiencing pain, clogged sensation and decreased hearing Start Augmentin 875-125 mg twice daily x10 days Tylenol as needed for pain

## 2021-11-01 NOTE — Progress Notes (Signed)
    Subjective:    Patient ID: Pamela Aguirre, female    DOB: 1980-11-18, 41 y.o.   MRN: [id number]      HPI Leoni is here for  Chief Complaint  Patient presents with   Conjunctivitis   Possible pink eye   Sinusitis    Her son had a virus and she now has it.  Her eyes have been red since Wednesday - it started in the left eye and then the right eye started.  They itch, are red, feel gritty, and she has discharge.   She is taking nothing for her cold symptoms.  She has a clogged left ear and it is painful.  She has decreased hearing.    She is breast feeding.      Medications and allergies reviewed with patient and updated if appropriate.  Current Outpatient Medications on File Prior to Visit  Medication Sig Dispense Refill   Prenatal Vit-Fe Fumarate-FA (PRENATAL MULTIVITAMIN) TABS tablet Take 1 tablet by mouth at bedtime.     No current facility-administered medications on file prior to visit.    Review of Systems  Constitutional:  Positive for fever (a few days ago).  HENT:  Positive for ear pain, hearing loss and sore throat. Negative for congestion and sinus pain.   Eyes:  Positive for discharge, redness and itching.  Respiratory:  Positive for cough. Negative for shortness of breath and wheezing.   Gastrointestinal:  Negative for abdominal pain.  Neurological:  Negative for dizziness and headaches.      Objective:   Vitals:   11/01/21 1314  BP: (!) 144/88  Pulse: 92  Temp: 97.8 F (36.6 C)  SpO2: 99%   BP Readings from Last 3 Encounters:  11/01/21 (!) 144/88  09/04/20 137/81  08/21/20 131/66   Wt Readings from Last 3 Encounters:  11/01/21 201 lb (91.2 kg)  09/03/20 244 lb (110.7 kg)  10/11/19 192 lb (87.1 kg)   Body mass index is 32.44 kg/m.    Physical Exam Constitutional:      General: She is not in acute distress.    Appearance: Normal appearance. She is not ill-appearing.  HENT:     Head: Normocephalic and atraumatic.     Right Ear:  Tympanic membrane, ear canal and external ear normal.     Left Ear: Ear canal and external ear normal.     Ears:     Comments: Left TM erythematous    Mouth/Throat:     Mouth: Mucous membranes are moist.     Pharynx: No oropharyngeal exudate or posterior oropharyngeal erythema.  Eyes:     General:        Right eye: Discharge present.        Left eye: Discharge present.    Comments: B/l conjunctivae erythematous  Cardiovascular:     Rate and Rhythm: Normal rate and regular rhythm.  Pulmonary:     Effort: Pulmonary effort is normal. No respiratory distress.     Breath sounds: Normal breath sounds. No wheezing or rales.  Musculoskeletal:     Cervical back: Neck supple. No tenderness.  Lymphadenopathy:     Cervical: No cervical adenopathy.  Skin:    General: Skin is warm and dry.  Neurological:     Mental Status: She is alert.           Assessment & Plan:    See Problem List for Assessment and Plan of chronic medical problems.

## 2021-11-26 ENCOUNTER — Encounter: Payer: Self-pay | Admitting: Internal Medicine

## 2021-11-26 NOTE — Progress Notes (Unsigned)
    Subjective:    Patient ID: Pamela Aguirre, female    DOB: June 24, 1980, 41 y.o.   MRN: [id number]      HPI Lyliana is here for No chief complaint on file.        Medications and allergies reviewed with patient and updated if appropriate.  Current Outpatient Medications on File Prior to Visit  Medication Sig Dispense Refill   amoxicillin-clavulanate (AUGMENTIN) 875-125 MG tablet Take 1 tablet by mouth 2 (two) times daily. 20 tablet 0   Prenatal Vit-Fe Fumarate-FA (PRENATAL MULTIVITAMIN) TABS tablet Take 1 tablet by mouth at bedtime.     trimethoprim-polymyxin b (POLYTRIM) ophthalmic solution Place 1 drop into the right eye every 4 (four) hours. Use for 7 days 10 mL 0   No current facility-administered medications on file prior to visit.    Review of Systems     Objective:  There were no vitals filed for this visit. BP Readings from Last 3 Encounters:  11/01/21 (!) 144/88  09/04/20 137/81  08/21/20 131/66   Wt Readings from Last 3 Encounters:  11/01/21 201 lb (91.2 kg)  09/03/20 244 lb (110.7 kg)  10/11/19 192 lb (87.1 kg)   There is no height or weight on file to calculate BMI.    Physical Exam         Assessment & Plan:    See Problem List for Assessment and Plan of chronic medical problems.

## 2021-11-27 ENCOUNTER — Ambulatory Visit: Payer: 59 | Admitting: Internal Medicine

## 2021-11-27 VITALS — BP 126/78 | HR 75 | Temp 97.8°F | Ht 66.0 in | Wt 202.2 lb

## 2021-11-27 DIAGNOSIS — O4410 Placenta previa with hemorrhage, unspecified trimester: Secondary | ICD-10-CM | POA: Insufficient documentation

## 2021-11-27 DIAGNOSIS — O99820 Streptococcus B carrier state complicating pregnancy: Secondary | ICD-10-CM | POA: Insufficient documentation

## 2021-11-27 DIAGNOSIS — K122 Cellulitis and abscess of mouth: Secondary | ICD-10-CM | POA: Diagnosis not present

## 2021-11-27 DIAGNOSIS — J309 Allergic rhinitis, unspecified: Secondary | ICD-10-CM

## 2021-11-27 DIAGNOSIS — O09529 Supervision of elderly multigravida, unspecified trimester: Secondary | ICD-10-CM | POA: Insufficient documentation

## 2021-11-27 MED ORDER — PREDNISONE 20 MG PO TABS: 40.0000 mg | ORAL_TABLET | Freq: Every day | ORAL | 0 refills | Status: AC

## 2021-11-27 NOTE — Assessment & Plan Note (Signed)
Chronic Not ideally controlled She is limited with what she can take because she is breast-feeding Continue daily antihistamine Given recent episode of uvulitis and not controlled allergic rhinitis will refer to allergy for further evaluation and treatment

## 2021-11-27 NOTE — Assessment & Plan Note (Signed)
Acute Developed a few days ago for no obvious reason She did go to urgent care and took prednisone 40 mg daily x3 days and it did improve the swelling and erythema, but her symptoms are still present Prednisone 40 mg daily x3 more days sent to pharmacy She denies anything new or obvious things that may have caused this which was likely an allergic reaction Denies a family history of uvulitis or lip swelling Will refer to allergy

## 2021-11-27 NOTE — Patient Instructions (Addendum)
     Medications changes include :  prednisone 40 mg daily x 3 days    Your prescription(s) have been sent to your pharmacy.    A referral was ordered for Allergist.     Someone from that office will call you to schedule an appointment.    Return if symptoms worsen or fail to improve.

## 2023-11-22 ENCOUNTER — Other Ambulatory Visit: Payer: Self-pay

## 2023-11-22 ENCOUNTER — Ambulatory Visit
Admission: RE | Admit: 2023-11-22 | Discharge: 2023-11-22 | Disposition: A | Discharge status: Home / Self Care | Payer: No Typology Code available for payment source | Source: Ambulatory Visit | Attending: Physician Assistant

## 2023-11-22 VITALS — BP 127/70 | HR 105 | Temp 98.7°F | Resp 20 | Ht 66.0 in | Wt 220.0 lb

## 2023-11-22 DIAGNOSIS — J020 Streptococcal pharyngitis: Secondary | ICD-10-CM | POA: Diagnosis not present

## 2023-11-22 DIAGNOSIS — J358 Other chronic diseases of tonsils and adenoids: Secondary | ICD-10-CM | POA: Diagnosis not present

## 2023-11-22 LAB — POC COVID19/FLU A&B COMBO
Covid Antigen, POC: NEGATIVE
Influenza A Antigen, POC: NEGATIVE
Influenza B Antigen, POC: NEGATIVE

## 2023-11-22 LAB — POCT RAPID STREP A (OFFICE): Rapid Strep A Screen: POSITIVE — AB

## 2023-11-22 MED ORDER — AMOXICILLIN 500 MG PO CAPS: 500.0000 mg | ORAL_CAPSULE | Freq: Two times a day (BID) | ORAL | 0 refills | Status: AC

## 2023-11-22 NOTE — ED Triage Notes (Signed)
 Pt presents with complaints of sore throat that started yesterday, 6/14. Pt states there are white streaks on back of throat. Unsure of fevers at home. Pt currently rates her overall throat pain a 5/10.
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
## 2023-11-22 NOTE — Discharge Instructions (Addendum)
 Your rapid Group A Strep test came back positive / your Strep culture came back positive  This indicates an active Strep Pharyngitis or strep throat infection which will need an antibiotic to resolve to prevent further complications I have sent in a prescription for Amoxicillin  500 mg to be taken by mouth twice per day for 10 days  FINISH THE ENTIRE COURSE unless you are instructed to stop or develop an allergic reaction  Stay well hydrated, I usually recommend consuming about 75 oz or more of water and hydrating beverages per day while recovering from such an infection.   You can use over the counter Ibuprofen  and Tylenol (alternating every 4 hours) as needed to assist with fever and pain/discomfort  I recommend discarding and replacing anything that you have used in your mouth in the last 72 hours prior to your symptoms - this includes your toothbrush, straws, mouth guards, etc. Unless you have a way of sanitizing them to reduce risk of reinfection   Do not share drinks or food with anyone until your antibiotic is complete   If you have further concerns or your symptoms seem like they are getting worse, please let us  know  If at any point you start to develop fevers that are not responding to medications, severe throat swelling, difficulty breathing, chest pain, palpitations, sandpaperlike rash please go to the emergency room as these could be signs of a medical emergency.

## 2023-12-13 ENCOUNTER — Ambulatory Visit
Admission: EM | Admit: 2023-12-13 | Discharge: 2023-12-13 | Disposition: A | Discharge status: Home / Self Care | Attending: Family Medicine | Admitting: Family Medicine

## 2023-12-13 DIAGNOSIS — J020 Streptococcal pharyngitis: Secondary | ICD-10-CM

## 2023-12-13 DIAGNOSIS — J309 Allergic rhinitis, unspecified: Secondary | ICD-10-CM | POA: Diagnosis not present

## 2023-12-13 LAB — POCT RAPID STREP A (OFFICE): Rapid Strep A Screen: POSITIVE — AB

## 2023-12-13 MED ORDER — CEFDINIR 300 MG PO CAPS: 300.0000 mg | ORAL_CAPSULE | Freq: Two times a day (BID) | ORAL | 0 refills | Status: AC

## 2023-12-13 NOTE — ED Triage Notes (Signed)
 Pt reports sore throat x 1 week. Pt has not taken any meds for complaint.   Pt requested Strep test. States she tested positive fro Strep on 11/22/23.

## 2023-12-13 NOTE — ED Provider Notes (Signed)
 [facility]  Note:  This document was prepared using Conservation officer, historic buildings and may include unintentional dictation errors.  MRN: [id number] DOB: [birth year]  Subjective:   Pamela Aguirre is a 43 y.o. female presenting for 1 week history of recurrent and persistent throat pain, scratchy throat eliciting a cough.  Has had frequent illness over the past 2 years since her son started going to daycare.  This includes strep infections.  She just completed a course of amoxicillin  after testing positive for strep in mid June.  Would like to be retested.  No current facility-administered medications for this encounter.  Current Outpatient Medications:    Prenatal Vit-Fe Fumarate-FA (PRENATAL MULTIVITAMIN) TABS tablet, Take 1 tablet by mouth at bedtime., Disp: , Rfl:    Allergies  Allergen Reactions   Lactose Intolerance (Gi)    Milk (Cow) Other (See Comments)    cow milk allergenic extract    Past Medical History:  Diagnosis Date   Allergy    Medical history non-contributory      Past Surgical History:  Procedure Laterality Date   NO PAST SURGERIES      Family History  Problem Relation Age of Onset   Hypertension Mother    Hypertension Maternal Grandmother    Heart disease Maternal Grandmother    Diabetes Maternal Aunt     Social History   Tobacco Use   Smoking status: Former   Smokeless tobacco: Never  Advertising account planner   Vaping status: Never Used  Substance Use Topics   Alcohol use: Not Currently    Comment: social   Drug use: No    ROS   Objective:   Vitals: BP 138/82 (BP Location: Left Arm)   Pulse 86   Temp 98.2 F (36.8 C) (Oral)   Resp 17   LMP  (Within Months) Comment: 1 month  SpO2 95%   Physical Exam Constitutional:      General: She is not in acute distress.    Appearance: Normal appearance. She is well-developed. She is not ill-appearing, toxic-appearing or diaphoretic.  HENT:     Head: Normocephalic and  atraumatic.     Nose: Nose normal.     Mouth/Throat:     Mouth: Mucous membranes are moist.     Pharynx: No pharyngeal swelling, oropharyngeal exudate, posterior oropharyngeal erythema or uvula swelling.     Tonsils: No tonsillar exudate or tonsillar abscesses. 0 on the right. 0 on the left.  Eyes:     General: No scleral icterus.       Right eye: No discharge.        Left eye: No discharge.     Extraocular Movements: Extraocular movements intact.  Cardiovascular:     Rate and Rhythm: Normal rate and regular rhythm.     Heart sounds: Normal heart sounds. No murmur heard.    No friction rub. No gallop.  Pulmonary:     Effort: Pulmonary effort is normal. No respiratory distress.     Breath sounds: No stridor. No wheezing, rhonchi or rales.  Chest:     Chest wall: No tenderness.  Skin:    General: Skin is warm and dry.  Neurological:     General: No focal deficit present.     Mental Status: She is alert and oriented to person, place, and time.  Psychiatric:        Mood and Affect: Mood normal.        Behavior: Behavior normal.  Results for orders placed or performed during the hospital encounter of 12/13/23 (from the past 24 hours)  POCT rapid strep A     Status: Abnormal   Collection Time: 12/13/23  1:17 PM  Result Value Ref Range   Rapid Strep A Screen Positive (A) Negative    Assessment and Plan :   PDMP not reviewed this encounter.  1. Strep pharyngitis   2. Allergic rhinitis, unspecified seasonality, unspecified trigger    Recommended cefdinir .  Use supportive care otherwise.  Counseled patient on potential for adverse effects with medications prescribed/recommended today, ER and return-to-clinic precautions discussed, patient verbalized understanding.    Christopher Savannah, [place] 12/13/23 1335
